# Patient Record
Sex: Female | Born: 2005 | Race: Black or African American | Hispanic: No | Marital: Single
Health system: Southern US, Community
[De-identification: ages and names within clinical notes are randomized; demographics above are authoritative.]

## PROBLEM LIST (undated history)

## (undated) DIAGNOSIS — Z789 Other specified health status: Secondary | ICD-10-CM

## (undated) HISTORY — PX: OTHER SURGICAL HISTORY: SHX169

## (undated) HISTORY — DX: Other specified health status: Z78.9

---
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
## 2023-01-16 DIAGNOSIS — Z113 Encounter for screening for infections with a predominantly sexual mode of transmission: Secondary | ICD-10-CM | POA: Diagnosis not present

## 2023-01-16 DIAGNOSIS — Z114 Encounter for screening for human immunodeficiency virus [HIV]: Secondary | ICD-10-CM | POA: Diagnosis not present

## 2023-01-23 DIAGNOSIS — A749 Chlamydial infection, unspecified: Secondary | ICD-10-CM | POA: Diagnosis not present

## 2023-03-03 DIAGNOSIS — N771 Vaginitis, vulvitis and vulvovaginitis in diseases classified elsewhere: Secondary | ICD-10-CM | POA: Diagnosis not present

## 2023-03-03 DIAGNOSIS — Z3202 Encounter for pregnancy test, result negative: Secondary | ICD-10-CM | POA: Diagnosis not present

## 2023-03-03 DIAGNOSIS — Z1331 Encounter for screening for depression: Secondary | ICD-10-CM | POA: Diagnosis not present

## 2023-03-03 DIAGNOSIS — Z01419 Encounter for gynecological examination (general) (routine) without abnormal findings: Secondary | ICD-10-CM | POA: Diagnosis not present

## 2023-03-22 DIAGNOSIS — N898 Other specified noninflammatory disorders of vagina: Secondary | ICD-10-CM | POA: Diagnosis not present

## 2023-05-07 ENCOUNTER — Encounter: Payer: Self-pay | Admitting: Advanced Practice Midwife

## 2023-05-07 ENCOUNTER — Ambulatory Visit: Payer: Medicaid Other | Admitting: Advanced Practice Midwife

## 2023-05-07 ENCOUNTER — Other Ambulatory Visit (HOSPITAL_COMMUNITY)
Admission: RE | Admit: 2023-05-07 | Discharge: 2023-05-07 | Disposition: A | Discharge status: Home / Self Care | Payer: Medicaid Other | Source: Ambulatory Visit | Attending: Advanced Practice Midwife | Admitting: Advanced Practice Midwife

## 2023-05-07 VITALS — BP 126/73 | HR 69 | Wt 181.0 lb

## 2023-05-07 DIAGNOSIS — Z113 Encounter for screening for infections with a predominantly sexual mode of transmission: Secondary | ICD-10-CM

## 2023-05-07 NOTE — Progress Notes (Signed)
GYN VISIT Patient name: Breanna Soto MRN [id number]  Date of birth: May 27, 2006 Chief Complaint:   Gynecologic Exam (Wants STD testing. No current symptoms)  History of Present Illness:   Breanna Soto is a 17 y.o. G0P0000 African-American female being seen today for STD testing; had unprotected sex ~3-4wks ago (prior to that hadn't had sex in a long time); not having any vag d/c/irritation/itching; no UTI s/s. Just concerned. Declines blood STD screening today.   Mentioned wanting to establish PCP- explained that we can be her gyn/future OB provider, but that she still needs a PCP for general health concerns.    Patient's last menstrual period was 04/28/2023 (exact date). The current method of family planning is none.  Last pap <21yo. Results were: N/A     05/07/2023    9:33 AM  Depression screen PHQ 2/9  Decreased Interest 0  Down, Depressed, Hopeless 0  PHQ - 2 Score 0  Altered sleeping 3  Tired, decreased energy 3  Change in appetite 2  Feeling bad or failure about yourself  0  Trouble concentrating 0  Moving slowly or fidgety/restless 0  Suicidal thoughts 0  PHQ-9 Score 8        05/07/2023    9:34 AM  GAD 7 : Generalized Anxiety Score  Nervous, Anxious, on Edge 3  Control/stop worrying 3  Worry too much - different things 3  Trouble relaxing 3  Restless 1  Easily annoyed or irritable 0  Afraid - awful might happen 0  Total GAD 7 Score 13     Review of Systems:   Pertinent items are noted in HPI Denies fever/chills, dizziness, headaches, visual disturbances, fatigue, shortness of breath, chest pain, abdominal pain, vomiting, abnormal vaginal discharge/itching/odor/irritation, problems with periods, bowel movements, urination, or intercourse unless otherwise stated above.  Pertinent History Reviewed:  Reviewed past medical,surgical, social, obstetrical and family history.  Reviewed problem list, medications and allergies. Physical Assessment:   Vitals:    05/07/23 0925  BP: 126/73  Pulse: 69  Weight: 181 lb (82.1 kg)  Body mass index is 29.21 kg/m.       Physical Examination:   General appearance: alert, well appearing, and in no distress  Mental status: alert, oriented to person, place, and time  Skin: warm & dry   Cardiovascular: normal heart rate noted  Respiratory: normal respiratory effort, no distress  Abdomen: soft, non-tender   Pelvic: normal external genitalia, vulva, vagina, cervix, uterus and adnexa  Extremities: no edema    No results found for this or any previous visit (from the past 24 hour(s)).  Assessment & Plan:  1) Concerned re STD exposure> CV swab to test all but yeast; declines STD bloodwork; discussion re need for contraception/condom use; declines contraception for now and is aware of options   Meds: No orders of the defined types were placed in this encounter.   No orders of the defined types were placed in this encounter.   No follow-ups on file.  Arabella Merles CNM 05/07/2023 9:58 AM

## 2023-05-08 LAB — CERVICOVAGINAL ANCILLARY ONLY
Bacterial Vaginitis (gardnerella): POSITIVE — AB
Chlamydia: NEGATIVE
Comment: NEGATIVE
Comment: NEGATIVE
Comment: NEGATIVE
Comment: NORMAL
Neisseria Gonorrhea: NEGATIVE
Trichomonas: NEGATIVE

## 2023-05-09 ENCOUNTER — Other Ambulatory Visit: Payer: Self-pay | Admitting: Advanced Practice Midwife

## 2023-05-09 DIAGNOSIS — B9689 Other specified bacterial agents as the cause of diseases classified elsewhere: Secondary | ICD-10-CM

## 2023-05-09 MED ORDER — METRONIDAZOLE 500 MG PO TABS: 500.0000 mg | ORAL_TABLET | Freq: Two times a day (BID) | ORAL | 0 refills | Status: DC

## 2023-06-24 ENCOUNTER — Other Ambulatory Visit: Payer: Self-pay | Admitting: Adult Health

## 2023-06-24 ENCOUNTER — Ambulatory Visit (INDEPENDENT_AMBULATORY_CARE_PROVIDER_SITE_OTHER): Payer: Medicaid Other

## 2023-06-24 VITALS — BP 133/91 | HR 69 | Ht 66.0 in | Wt 170.0 lb

## 2023-06-24 DIAGNOSIS — Z3201 Encounter for pregnancy test, result positive: Secondary | ICD-10-CM | POA: Diagnosis not present

## 2023-06-24 LAB — POCT URINE PREGNANCY: Preg Test, Ur: POSITIVE — AB

## 2023-06-24 MED ORDER — ONDANSETRON HCL 4 MG PO TABS: 4.0000 mg | ORAL_TABLET | Freq: Three times a day (TID) | ORAL | 1 refills | Status: DC | PRN

## 2023-06-24 MED ORDER — PRENATAL PLUS 27-1 MG PO TABS: 1.0000 | ORAL_TABLET | Freq: Every day | ORAL | 12 refills | Status: DC

## 2023-06-24 NOTE — Progress Notes (Signed)
Rx PNV and zofran

## 2023-06-24 NOTE — Progress Notes (Signed)
   NURSE VISIT- PREGNANCY CONFIRMATION   SUBJECTIVE:  Breanna Soto is a 17 y.o. G1P0000 female at [redacted]w[redacted]d by certain LMP of Patient's last menstrual period was 05/25/2023 (exact date). Here for pregnancy confirmation.  Home pregnancy test: positive x 4   She reports nausea, vomiting and cramping.  She is not taking prenatal vitamins.    OBJECTIVE:  BP (!) 133/91 (BP Location: Right Arm, Patient Position: Sitting, Cuff Size: Normal)   Pulse 69   Ht 5\' 6"  (1.676 m)   Wt 170 lb (77.1 kg)   LMP 05/25/2023 (Exact Date)   BMI 27.44 kg/m   Appears well, in no apparent distress  Results for orders placed or performed in visit on 06/24/23 (from the past 24 hour(s))  POCT urine pregnancy   Collection Time: 06/24/23  9:13 AM  Result Value Ref Range   Preg Test, Ur Positive (A) Negative    ASSESSMENT: Positive pregnancy test, [redacted]w[redacted]d by LMP    PLAN: Schedule for dating ultrasound in 3 weeks Prenatal vitamins: note routed to Cyril Mourning NP to send prescription   Nausea medicines: requested-note routed to Cyril Mourning NP to send prescription   OB packet given: Herminio Commons  06/24/2023 9:13 AM

## 2023-07-13 ENCOUNTER — Other Ambulatory Visit: Payer: Self-pay | Admitting: Adult Health

## 2023-07-13 DIAGNOSIS — O3680X0 Pregnancy with inconclusive fetal viability, not applicable or unspecified: Secondary | ICD-10-CM

## 2023-07-15 ENCOUNTER — Other Ambulatory Visit: Payer: Medicaid Other | Admitting: Radiology

## 2023-07-15 DIAGNOSIS — Z3491 Encounter for supervision of normal pregnancy, unspecified, first trimester: Secondary | ICD-10-CM | POA: Diagnosis not present

## 2023-07-15 DIAGNOSIS — Z3A01 Less than 8 weeks gestation of pregnancy: Secondary | ICD-10-CM | POA: Diagnosis not present

## 2023-07-15 DIAGNOSIS — O3680X0 Pregnancy with inconclusive fetal viability, not applicable or unspecified: Secondary | ICD-10-CM

## 2023-07-15 NOTE — Progress Notes (Addendum)
GA 7+2 by LMP 05/25/23  Retroflexed uterus with single early GS intact within mid fundus CRL = 9.1 mm correlates to [redacted]w[redacted]d   FHR = 131 bpm    YS = 4.7 mm Normal Rt ov  -  L ov not seen  -  neg adnexal regions  -  neg CDS - no free fluid

## 2023-08-19 ENCOUNTER — Other Ambulatory Visit: Payer: Self-pay | Admitting: Obstetrics & Gynecology

## 2023-08-19 ENCOUNTER — Encounter: Payer: Self-pay | Admitting: Women's Health

## 2023-08-19 DIAGNOSIS — Z3400 Encounter for supervision of normal first pregnancy, unspecified trimester: Secondary | ICD-10-CM | POA: Insufficient documentation

## 2023-08-19 DIAGNOSIS — Z3682 Encounter for antenatal screening for nuchal translucency: Secondary | ICD-10-CM

## 2023-08-21 ENCOUNTER — Ambulatory Visit (INDEPENDENT_AMBULATORY_CARE_PROVIDER_SITE_OTHER): Payer: Medicaid Other

## 2023-08-21 ENCOUNTER — Encounter: Payer: Self-pay | Admitting: Women's Health

## 2023-08-21 ENCOUNTER — Encounter: Payer: Medicaid Other | Admitting: *Deleted

## 2023-08-21 ENCOUNTER — Other Ambulatory Visit (HOSPITAL_COMMUNITY)
Admission: RE | Admit: 2023-08-21 | Discharge: 2023-08-21 | Disposition: A | Discharge status: Home / Self Care | Payer: Medicaid Other | Source: Ambulatory Visit | Attending: Women's Health | Admitting: Women's Health

## 2023-08-21 ENCOUNTER — Ambulatory Visit: Payer: Medicaid Other | Admitting: Women's Health

## 2023-08-21 VITALS — BP 122/66 | HR 62 | Wt 178.0 lb

## 2023-08-21 DIAGNOSIS — Z131 Encounter for screening for diabetes mellitus: Secondary | ICD-10-CM

## 2023-08-21 DIAGNOSIS — Z113 Encounter for screening for infections with a predominantly sexual mode of transmission: Secondary | ICD-10-CM | POA: Insufficient documentation

## 2023-08-21 DIAGNOSIS — O26891 Other specified pregnancy related conditions, first trimester: Secondary | ICD-10-CM | POA: Diagnosis not present

## 2023-08-21 DIAGNOSIS — Z1332 Encounter for screening for maternal depression: Secondary | ICD-10-CM

## 2023-08-21 DIAGNOSIS — Z23 Encounter for immunization: Secondary | ICD-10-CM

## 2023-08-21 DIAGNOSIS — N898 Other specified noninflammatory disorders of vagina: Secondary | ICD-10-CM

## 2023-08-21 DIAGNOSIS — Z6827 Body mass index (BMI) 27.0-27.9, adult: Secondary | ICD-10-CM

## 2023-08-21 DIAGNOSIS — Z3401 Encounter for supervision of normal first pregnancy, first trimester: Secondary | ICD-10-CM

## 2023-08-21 DIAGNOSIS — Z3682 Encounter for antenatal screening for nuchal translucency: Secondary | ICD-10-CM

## 2023-08-21 DIAGNOSIS — Z3A12 12 weeks gestation of pregnancy: Secondary | ICD-10-CM

## 2023-08-21 LAB — CERVICOVAGINAL ANCILLARY ONLY
Bacterial Vaginitis (gardnerella): POSITIVE — AB
Candida Glabrata: NEGATIVE
Candida Vaginitis: NEGATIVE
Chlamydia: NEGATIVE
Comment: NEGATIVE
Comment: NEGATIVE
Comment: NEGATIVE
Comment: NEGATIVE
Comment: NEGATIVE
Comment: NORMAL
Neisseria Gonorrhea: NEGATIVE
Trichomonas: NEGATIVE

## 2023-08-21 MED ORDER — BLOOD PRESSURE MONITOR MISC: 0 refills | Status: DC

## 2023-08-21 MED ORDER — PROMETHAZINE HCL 6.25 MG/5ML PO SOLN: 25.0000 mg | Freq: Four times a day (QID) | ORAL | 6 refills | Status: DC | PRN

## 2023-08-21 MED ORDER — ASPIRIN 81 MG PO TBEC: 81.0000 mg | DELAYED_RELEASE_TABLET | Freq: Every day | ORAL | 3 refills | Status: DC

## 2023-08-21 NOTE — Progress Notes (Signed)
 INITIAL OBSTETRICAL VISIT Patient name: Breanna Soto MRN [id number]  Date of birth: May 31, 2006 Chief Complaint:   Initial Prenatal Visit (Discharge with odor)  History of Present Illness:   Breanna Soto is a 18 y.o. G72P0000 African-American female at [redacted]w[redacted]d by LMP c/w u/s at 6 weeks with an Estimated Date of Delivery: 02/29/24 being seen today for her initial obstetrical visit.   Patient's last menstrual period was 05/25/2023 (exact date). Her obstetrical history is significant for primigravida.   Today she reports N/V-has rx for zofran , hard time swallowing it. Vaginal d/c w/ odor, no itching/irritation. Last pap <21yo. Results were: N/A     08/21/2023   10:41 AM 05/07/2023    9:33 AM  Depression screen PHQ 2/9  Decreased Interest 1 0  Down, Depressed, Hopeless 0 0  PHQ - 2 Score 1 0  Altered sleeping 2 3  Tired, decreased energy 2 3  Change in appetite 2 2  Feeling bad or failure about yourself  0 0  Trouble concentrating 0 0  Moving slowly or fidgety/restless 0 0  Suicidal thoughts 0 0  PHQ-9 Score 7 8        08/21/2023   10:42 AM 05/07/2023    9:34 AM  GAD 7 : Generalized Anxiety Score  Nervous, Anxious, on Edge 2 3  Control/stop worrying 2 3  Worry too much - different things 2 3  Trouble relaxing 1 3  Restless 0 1  Easily annoyed or irritable 1 0  Afraid - awful might happen 3 0  Total GAD 7 Score 11 13     Review of Systems:   Pertinent items are noted in HPI Denies cramping/contractions, leakage of fluid, vaginal bleeding, abnormal vaginal discharge w/ itching/odor/irritation, headaches, visual changes, shortness of breath, chest pain, abdominal pain, severe nausea/vomiting, or problems with urination or bowel movements unless otherwise stated above.  Pertinent History Reviewed:  Reviewed past medical,surgical, social, obstetrical and family history.  Reviewed problem list, medications and allergies. OB History  Gravida Para Term Preterm AB Living  1 0 0 0 0 0   SAB IAB Ectopic Multiple Live Births  0 0 0 0 0    # Outcome Date GA Lbr Len/2nd Weight Sex Type Anes PTL Lv  1 Current            Physical Assessment:   Vitals:   08/21/23 1123  BP: 122/66  Pulse: 62  Weight: 178 lb (80.7 kg)  Body mass index is 28.73 kg/m.       Physical Examination:  General appearance - well appearing, and in no distress  Mental status - alert, oriented to person, place, and time  Psych:  She has a normal mood and affect  Skin - warm and dry, normal color, no suspicious lesions noted  Chest - effort normal, all lung fields clear to auscultation bilaterally  Heart - normal rate and regular rhythm  Abdomen - soft, nontender  Extremities:  No swelling or varicosities noted  Pelvic - VULVA: normal appearing vulva with no masses, tenderness or lesions  VAGINA: normal appearing vagina with normal color and discharge, no lesions  CERVIX: normal appearing cervix without discharge or lesions, no CMT  Thin prep pap is not done   Chaperone: Peggy Dones    TODAY'S NT US  12+4 wks,measurements c/w dates,NB present,NT 1.5 mm,normal ovaries,anterior placenta,FHR 152 bpm,CRL 61.23 mm,limited view because of fetal position   No results found for this or any previous visit (from the past 24  hours).  Assessment & Plan:  1) Low-Risk Pregnancy G1P0000 at [redacted]w[redacted]d with an Estimated Date of Delivery: 02/29/24   2) Initial OB visit  3) Vaginal d/c w/ odor> CV swab   4) N/V> rx phenergan  liquid (hard time swallowing zofran  pills she has)  Meds:  Meds ordered this encounter  Medications   Blood Pressure Monitor MISC    Sig: For regular home bp monitoring during pregnancy    Dispense:  1 each    Refill:  0    Z34.81 Please mail to patient   aspirin  EC 81 MG tablet    Sig: Take 1 tablet (81 mg total) by mouth daily. Swallow whole.    Dispense:  90 tablet    Refill:  3   promethazine  (PHENERGAN ) 6.25 MG/5ML solution    Sig: Take 20 mLs (25 mg total) by mouth every 6 (six)  hours as needed for nausea or vomiting.    Dispense:  120 mL    Refill:  6    Initial labs obtained Continue prenatal vitamins Reviewed n/v relief measures and warning s/s to report Reviewed recommended weight gain based on pre-gravid BMI Encouraged well-balanced diet Genetic & carrier screening discussed: requests Panorama, NT/IT, and Horizon  Ultrasound discussed; fetal survey: requested CCNC completed> form faxed if has or is planning to apply for medicaid The nature of [employer] for [employer] with multiple MDs and other Advanced Practice Providers was explained to patient; also emphasized that fellows, residents, and students are part of our team. Does not have home bp cuff. Office bp cuff given: no. Rx sent: yes. Check bp weekly, let us  know if consistently >140/90.  Flu shot today  Indications for ASA therapy (per uptodate) OR Two or more of the following: Nulliparity Yes Sociodemographic characteristics (African American race, low socioeconomic level) Yes  Follow-up: Return in about 4 weeks (around 09/18/2023) for LROB, 2nd IT, CNM, in person; then 7wks from now anatomy u/s and LROB .   Orders Placed This Encounter  Procedures   Urine Culture   Integrated 1   Protein / creatinine ratio, urine   HORIZON CUSTOM   Hemoglobin A1c   CBC/D/Plt+RPR+Rh+ABO+RubIgG...   [facility] PRENATAL TEST    Suzen JONELLE Fetters CNM, [facility] 08/21/2023 11:47 AM

## 2023-08-21 NOTE — Patient Instructions (Signed)
Breanna Soto, thank you for choosing our office today! We appreciate the opportunity to meet your healthcare needs. You may receive a short survey by mail, e-mail, or through [employer]. If you are happy with your care we would appreciate if you could take just a few minutes to complete the survey questions. We read all of your comments and take your feedback very seriously. Thank you again for choosing our office.  [facility] at [place] at [facility] ([place], [place] [address]) Entrance C, located off of [address] parking   Nausea & Vomiting Have saltine crackers or pretzels by your bed and eat a few bites before you raise your head out of bed in the morning Eat small frequent meals throughout the day instead of large meals Drink plenty of fluids throughout the day to stay hydrated, just don't drink a lot of fluids with your meals.  This can make your stomach fill up faster making you feel sick Do not brush your teeth right after you eat Products with real ginger are good for nausea, like ginger ale and ginger hard candy Make sure it says made with real ginger! Sucking on sour candy like lemon heads is also good for nausea If your prenatal vitamins make you nauseated, take them at night so you will sleep through the nausea Sea Bands If you feel like you need medicine for the nausea & vomiting please let us know If you are unable to keep any fluids or food down please let us know   Constipation Drink plenty of fluid, preferably water, throughout the day Eat foods high in fiber such as fruits, vegetables, and grains Exercise, such as walking, is a good way to keep your bowels regular Drink warm fluids, especially warm prune juice, or decaf coffee Eat a 1/2 cup of real oatmeal (not instant), 1/2 cup applesauce, and 1/2-1 cup warm prune juice every day If needed, you may take Colace (docusate sodium) stool softener  once or twice a day to help keep the stool soft.  If you still are having problems with constipation, you may take Miralax once daily as needed to help keep your bowels regular.   Home Blood Pressure Monitoring for Patients   Your provider has recommended that you check your blood pressure (BP) at least once a week at home. If you do not have a blood pressure cuff at home, one will be provided for you. Contact your provider if you have not received your monitor within 1 week.   Helpful Tips for Accurate Home Blood Pressure Checks  Don't smoke, exercise, or drink caffeine 30 minutes before checking your BP Use the restroom before checking your BP (a full bladder can raise your pressure) Relax in a comfortable upright chair Feet on the ground Left arm resting comfortably on a flat surface at the level of your heart Legs uncrossed Back supported Sit quietly and don't talk Place the cuff on your bare arm Adjust snuggly, so that only two fingertips can fit between your skin and the top of the cuff Check 2 readings separated by at least one minute Keep a log of your BP readings For a visual, please reference this diagram: http://ccnc.care/bpdiagram  Provider Name: [facility]     Phone: [phone]  Zone 1: ALL CLEAR  Continue to monitor your symptoms:  BP reading is less than 140 (top number) or less than 90 (bottom  number)  No right upper stomach pain No headaches or seeing spots No feeling nauseated or throwing up No swelling in face and hands  Zone 2: CAUTION Call your doctor's office for any of the following:  BP reading is greater than 140 (top number) or greater than 90 (bottom number)  Stomach pain under your ribs in the middle or right side Headaches or seeing spots Feeling nauseated or throwing up Swelling in face and hands  Zone 3: EMERGENCY  Seek immediate medical care if you have any of the following:  BP reading is greater than160 (top number) or greater than  110 (bottom number) Severe headaches not improving with Tylenol Serious difficulty catching your breath Any worsening symptoms from Zone 2    First Trimester of Pregnancy The first trimester of pregnancy is from week 1 until the end of week 12 (months 1 through 3). A week after a sperm fertilizes an egg, the egg will implant on the wall of the uterus. This embryo will begin to develop into a baby. Genes from you and your partner are forming the baby. The female genes determine whether the baby is a boy or a girl. At 6-8 weeks, the eyes and face are formed, and the heartbeat can be seen on ultrasound. At the end of 12 weeks, all the baby's organs are formed.  Now that you are pregnant, you will want to do everything you can to have a healthy baby. Two of the most important things are to get good prenatal care and to follow your health care provider's instructions. Prenatal care is all the medical care you receive before the baby's birth. This care will help prevent, find, and treat any problems during the pregnancy and childbirth. BODY CHANGES Your body goes through many changes during pregnancy. The changes vary from woman to woman.  You may gain or lose a couple of pounds at first. You may feel sick to your stomach (nauseous) and throw up (vomit). If the vomiting is uncontrollable, call your health care provider. You may tire easily. You may develop headaches that can be relieved by medicines approved by your health care provider. You may urinate more often. Painful urination may mean you have a bladder infection. You may develop heartburn as a result of your pregnancy. You may develop constipation because certain hormones are causing the muscles that push waste through your intestines to slow down. You may develop hemorrhoids or swollen, bulging veins (varicose veins). Your breasts may begin to grow larger and become tender. Your nipples may stick out more, and the tissue that surrounds them  (areola) may become darker. Your gums may bleed and may be sensitive to brushing and flossing. Dark spots or blotches (chloasma, mask of pregnancy) may develop on your face. This will likely fade after the baby is born. Your menstrual periods will stop. You may have a loss of appetite. You may develop cravings for certain kinds of food. You may have changes in your emotions from day to day, such as being excited to be pregnant or being concerned that something may go wrong with the pregnancy and baby. You may have more vivid and strange dreams. You may have changes in your hair. These can include thickening of your hair, rapid growth, and changes in texture. Some women also have hair loss during or after pregnancy, or hair that feels dry or thin. Your hair will most likely return to normal after your baby is born. WHAT TO EXPECT AT YOUR PRENATAL  VISITS During a routine prenatal visit: You will be weighed to make sure you and the baby are growing normally. Your blood pressure will be taken. Your abdomen will be measured to track your baby's growth. The fetal heartbeat will be listened to starting around week 10 or 12 of your pregnancy. Test results from any previous visits will be discussed. Your health care provider may ask you: How you are feeling. If you are feeling the baby move. If you have had any abnormal symptoms, such as leaking fluid, bleeding, severe headaches, or abdominal cramping. If you have any questions. Other tests that may be performed during your first trimester include: Blood tests to find your blood type and to check for the presence of any previous infections. They will also be used to check for low iron levels (anemia) and Rh antibodies. Later in the pregnancy, blood tests for diabetes will be done along with other tests if problems develop. Urine tests to check for infections, diabetes, or protein in the urine. An ultrasound to confirm the proper growth and development  of the baby. An amniocentesis to check for possible genetic problems. Fetal screens for spina bifida and Down syndrome. You may need other tests to make sure you and the baby are doing well. HOME CARE INSTRUCTIONS  Medicines Follow your health care provider's instructions regarding medicine use. Specific medicines may be either safe or unsafe to take during pregnancy. Take your prenatal vitamins as directed. If you develop constipation, try taking a stool softener if your health care provider approves. Diet Eat regular, well-balanced meals. Choose a variety of foods, such as meat or vegetable-based protein, fish, milk and low-fat dairy products, vegetables, fruits, and whole grain breads and cereals. Your health care provider will help you determine the amount of weight gain that is right for you. Avoid raw meat and uncooked cheese. These carry germs that can cause birth defects in the baby. Eating four or five small meals rather than three large meals a day may help relieve nausea and vomiting. If you start to feel nauseous, eating a few soda crackers can be helpful. Drinking liquids between meals instead of during meals also seems to help nausea and vomiting. If you develop constipation, eat more high-fiber foods, such as fresh vegetables or fruit and whole grains. Drink enough fluids to keep your urine clear or pale yellow. Activity and Exercise Exercise only as directed by your health care provider. Exercising will help you: Control your weight. Stay in shape. Be prepared for labor and delivery. Experiencing pain or cramping in the lower abdomen or low back is a good sign that you should stop exercising. Check with your health care provider before continuing normal exercises. Try to avoid standing for long periods of time. Move your legs often if you must stand in one place for a long time. Avoid heavy lifting. Wear low-heeled shoes, and practice good posture. You may continue to have sex  unless your health care provider directs you otherwise. Relief of Pain or Discomfort Wear a good support bra for breast tenderness.   Take warm sitz baths to soothe any pain or discomfort caused by hemorrhoids. Use hemorrhoid cream if your health care provider approves.   Rest with your legs elevated if you have leg cramps or low back pain. If you develop varicose veins in your legs, wear support hose. Elevate your feet for 15 minutes, 3-4 times a day. Limit salt in your diet. Prenatal Care Schedule your prenatal visits by the  twelfth week of pregnancy. They are usually scheduled monthly at first, then more often in the last 2 months before delivery. Write down your questions. Take them to your prenatal visits. Keep all your prenatal visits as directed by your health care provider. Safety Wear your seat belt at all times when driving. Make a list of emergency phone numbers, including numbers for family, friends, the hospital, and police and fire departments. General Tips Ask your health care provider for a referral to a local prenatal education class. Begin classes no later than at the beginning of month 6 of your pregnancy. Ask for help if you have counseling or nutritional needs during pregnancy. Your health care provider can offer advice or refer you to specialists for help with various needs. Do not use hot tubs, steam rooms, or saunas. Do not douche or use tampons or scented sanitary pads. Do not cross your legs for long periods of time. Avoid cat litter boxes and soil used by cats. These carry germs that can cause birth defects in the baby and possibly loss of the fetus by miscarriage or stillbirth. Avoid all smoking, herbs, alcohol, and medicines not prescribed by your health care provider. Chemicals in these affect the formation and growth of the baby. Schedule a dentist appointment. At home, brush your teeth with a soft toothbrush and be gentle when you floss. SEEK MEDICAL CARE IF:   You have dizziness. You have mild pelvic cramps, pelvic pressure, or nagging pain in the abdominal area. You have persistent nausea, vomiting, or diarrhea. You have a bad smelling vaginal discharge. You have pain with urination. You notice increased swelling in your face, hands, legs, or ankles. SEEK IMMEDIATE MEDICAL CARE IF:  You have a fever. You are leaking fluid from your vagina. You have spotting or bleeding from your vagina. You have severe abdominal cramping or pain. You have rapid weight gain or loss. You vomit blood or material that looks like coffee grounds. You are exposed to [name] measles and have never had them. You are exposed to fifth disease or chickenpox. You develop a severe headache. You have shortness of breath. You have any kind of trauma, such as from a fall or a car accident. Document Released: 07/30/2001 Document Revised: 12/20/2013 Document Reviewed: 06/15/2013 [facility] Patient Information 2015 [place], [place]. This information is not intended to replace advice given to you by your health care provider. Make sure you discuss any questions you have with your health care provider.

## 2023-08-21 NOTE — Progress Notes (Addendum)
 [name] 12+4 wks,measurements c/w dates,NB present,NT 1.5 mm,normal ovaries,anterior placenta,FHR 152 bpm,CRL 61.23 mm,limited view because of fetal position

## 2023-08-22 LAB — INTEGRATED 1

## 2023-08-23 LAB — CBC/D/PLT+RPR+RH+ABO+RUBIGG...
Antibody Screen: NEGATIVE
Basophils Absolute: 0 10*3/uL (ref 0.0–0.2)
Basos: 0 %
EOS (ABSOLUTE): 0.1 10*3/uL (ref 0.0–0.4)
Eos: 1 %
HCV Ab: NONREACTIVE
HIV Screen 4th Generation wRfx: NONREACTIVE
Hematocrit: 40.9 % (ref 34.0–46.6)
Hemoglobin: 13.4 g/dL (ref 11.1–15.9)
Hepatitis B Surface Ag: NEGATIVE
Immature Grans (Abs): 0 10*3/uL (ref 0.0–0.1)
Immature Granulocytes: 0 %
Lymphocytes Absolute: 1.9 10*3/uL (ref 0.7–3.1)
Lymphs: 19 %
MCH: 27.6 pg (ref 26.6–33.0)
MCHC: 32.8 g/dL (ref 31.5–35.7)
MCV: 84 fL (ref 79–97)
Monocytes Absolute: 0.5 10*3/uL (ref 0.1–0.9)
Monocytes: 5 %
Neutrophils Absolute: 7.3 10*3/uL — ABNORMAL HIGH (ref 1.4–7.0)
Neutrophils: 75 %
Platelets: 204 10*3/uL (ref 150–450)
RBC: 4.85 x10E6/uL (ref 3.77–5.28)
RDW: 14.5 % (ref 11.7–15.4)
RPR Ser Ql: NONREACTIVE
Rh Factor: POSITIVE
Rubella Antibodies, IGG: 1.48 {index} (ref >0.99)
WBC: 9.9 10*3/uL (ref 3.4–10.8)

## 2023-08-23 LAB — HEMOGLOBIN A1C
Est. average glucose Bld gHb Est-mCnc: 91 mg/dL
Hgb A1c MFr Bld: 4.8 % (ref 4.8–5.6)

## 2023-08-23 LAB — INTEGRATED 1
Crown Rump Length: 61.2 mm
Gest. Age on Collection Date: 12.4 wk
PAPP-A Value: 293.4 ng/mL
Race: 1
Sonographer ID#: 20.4 a
Sonographer ID#: 309760
Weight: 1.5 mm
Weight: 178 [lb_av]

## 2023-08-23 LAB — HCV INTERPRETATION

## 2023-08-23 LAB — PROTEIN / CREATININE RATIO, URINE
Creatinine, Urine: 168.5 mg/dL
Protein, Ur: 9 mg/dL
Protein/Creat Ratio: 53 mg/g{creat} (ref 0–200)

## 2023-08-23 LAB — URINE CULTURE

## 2023-08-25 ENCOUNTER — Other Ambulatory Visit: Payer: Self-pay | Admitting: Adult Health

## 2023-08-25 MED ORDER — METRONIDAZOLE 500 MG PO TABS: 500.0000 mg | ORAL_TABLET | Freq: Two times a day (BID) | ORAL | 0 refills | Status: DC

## 2023-08-28 LAB — PANORAMA PRENATAL TEST FULL PANEL:PANORAMA TEST PLUS 5 ADDITIONAL MICRODELETIONS: FETAL FRACTION: 9.5

## 2023-08-30 DIAGNOSIS — Z3401 Encounter for supervision of normal first pregnancy, first trimester: Secondary | ICD-10-CM | POA: Diagnosis not present

## 2023-08-30 LAB — HORIZON CUSTOM: REPORT SUMMARY: POSITIVE — AB

## 2023-09-01 ENCOUNTER — Encounter: Payer: Self-pay | Admitting: Women's Health

## 2023-09-01 DIAGNOSIS — O285 Abnormal chromosomal and genetic finding on antenatal screening of mother: Secondary | ICD-10-CM | POA: Insufficient documentation

## 2023-09-05 ENCOUNTER — Other Ambulatory Visit: Payer: Self-pay

## 2023-09-05 ENCOUNTER — Encounter (HOSPITAL_COMMUNITY): Payer: Self-pay

## 2023-09-05 ENCOUNTER — Emergency Department (HOSPITAL_COMMUNITY)
Admission: EM | Admit: 2023-09-05 | Discharge: 2023-09-05 | Discharge status: Against Medical Advice | Payer: Medicaid Other | Attending: Emergency Medicine | Admitting: Emergency Medicine

## 2023-09-05 DIAGNOSIS — Z3A14 14 weeks gestation of pregnancy: Secondary | ICD-10-CM | POA: Insufficient documentation

## 2023-09-05 DIAGNOSIS — O218 Other vomiting complicating pregnancy: Secondary | ICD-10-CM | POA: Diagnosis not present

## 2023-09-05 DIAGNOSIS — Z5321 Procedure and treatment not carried out due to patient leaving prior to being seen by health care provider: Secondary | ICD-10-CM | POA: Diagnosis not present

## 2023-09-05 LAB — CBC WITH DIFFERENTIAL/PLATELET
Abs Immature Granulocytes: 0.05 10*3/uL (ref 0.00–0.07)
Basophils Absolute: 0 10*3/uL (ref 0.0–0.1)
Basophils Relative: 0 %
Eosinophils Absolute: 0 10*3/uL (ref 0.0–0.5)
Eosinophils Relative: 0 %
HCT: 42.7 % (ref 36.0–46.0)
Hemoglobin: 15.2 g/dL — ABNORMAL HIGH (ref 12.0–15.0)
Immature Granulocytes: 0 %
Lymphocytes Relative: 11 %
Lymphs Abs: 1.4 10*3/uL (ref 0.7–4.0)
MCH: 28.9 pg (ref 26.0–34.0)
MCHC: 35.6 g/dL (ref 30.0–36.0)
MCV: 81.2 fL (ref 80.0–100.0)
Monocytes Absolute: 0.4 10*3/uL (ref 0.1–1.0)
Monocytes Relative: 3 %
Neutro Abs: 10.8 10*3/uL — ABNORMAL HIGH (ref 1.7–7.7)
Neutrophils Relative %: 86 %
Platelets: 218 10*3/uL (ref 150–400)
RBC: 5.26 MIL/uL — ABNORMAL HIGH (ref 3.87–5.11)
RDW: 13.6 % (ref 11.5–15.5)
WBC: 12.7 10*3/uL — ABNORMAL HIGH (ref 4.0–10.5)
nRBC: 0 % (ref 0.0–0.2)

## 2023-09-05 LAB — BASIC METABOLIC PANEL
Anion gap: 8 (ref 5–15)
BUN: 7 mg/dL (ref 6–20)
CO2: 22 mmol/L (ref 22–32)
Calcium: 9.8 mg/dL (ref 8.9–10.3)
Chloride: 104 mmol/L (ref 98–111)
Creatinine, Ser: 0.68 mg/dL (ref 0.44–1.00)
GFR, Estimated: >60 mL/min (ref >60)
Glucose, Bld: 91 mg/dL (ref 70–99)
Potassium: 3.8 mmol/L (ref 3.5–5.1)
Sodium: 134 mmol/L — ABNORMAL LOW (ref 135–145)

## 2023-09-05 NOTE — ED Triage Notes (Signed)
Pt stated that she is [redacted] wks pregnant and began throwing up and noticed a bright red tinge in her vomit.

## 2023-09-18 ENCOUNTER — Encounter: Payer: Self-pay | Admitting: Advanced Practice Midwife

## 2023-09-18 ENCOUNTER — Ambulatory Visit (INDEPENDENT_AMBULATORY_CARE_PROVIDER_SITE_OTHER): Payer: Medicaid Other | Admitting: Advanced Practice Midwife

## 2023-09-18 VITALS — BP 123/70 | HR 94 | Wt 182.0 lb

## 2023-09-18 DIAGNOSIS — Z3402 Encounter for supervision of normal first pregnancy, second trimester: Secondary | ICD-10-CM

## 2023-09-18 DIAGNOSIS — Z3A16 16 weeks gestation of pregnancy: Secondary | ICD-10-CM

## 2023-09-18 DIAGNOSIS — Z1379 Encounter for other screening for genetic and chromosomal anomalies: Secondary | ICD-10-CM

## 2023-09-18 DIAGNOSIS — Z3401 Encounter for supervision of normal first pregnancy, first trimester: Secondary | ICD-10-CM

## 2023-09-18 NOTE — Patient Instructions (Signed)
Marlia, if you would like to speak with a Dentist (no charge) to discuss your test results and/or set up testing for your partner, you may schedule a telephone genetic information session by calling [phone]>option 5 (all inquiries)> option 1 (patient)> option 4 (schedule genetic information session) or visiting [email or web address] Let me know if you have any questions.  Drenda Freeze

## 2023-09-18 NOTE — Progress Notes (Addendum)
   LOW-RISK PREGNANCY VISIT Patient name: Breanna Soto MRN [id number]  Date of birth: 01-01-2006 Chief Complaint:   Routine Prenatal Visit (2nd IT)  History of Present Illness:   Breanna Soto is a 18 y.o. G12P0000 female at [redacted]w[redacted]d with an Estimated Date of Delivery: 02/29/24 being seen today for ongoing management of a low-risk pregnancy.  Today she reports fatigue. Contractions: Not present.  .  Movement: Absent. denies leaking of fluid. Review of Systems:   Pertinent items are noted in HPI Denies abnormal vaginal discharge w/ itching/odor/irritation, headaches, visual changes, shortness of breath, chest pain, abdominal pain, severe nausea/vomiting, or problems with urination or bowel movements unless otherwise stated above. Pertinent History Reviewed:  Reviewed past medical,surgical, social, obstetrical and family history.  Reviewed problem list, medications and allergies. Physical Assessment:   Vitals:   09/18/23 0826  BP: 123/70  Pulse: 94  Weight: 182 lb (82.6 kg)  Body mass index is 29.38 kg/m.        Physical Examination:   General appearance: Well appearing, and in no distress  Mental status: Alert, oriented to person, place, and time  Skin: Warm & dry  Cardiovascular: Normal heart rate noted  Respiratory: Normal respiratory effort, no distress  Abdomen: Soft, gravid, nontender  Pelvic: Cervical exam deferred         Extremities: Edema: None Chaperone:  N/A   Fetal Status: Fetal Heart Rate (bpm): 152   Movement: Absent      No results found for this or any previous visit (from the past 24 hours).  Assessment & Plan:    Pregnancy: G1P0000 at [redacted]w[redacted]d 1. Encounter for supervision of normal first pregnancy in first trimester (Primary)   2. [redacted] weeks gestation of pregnancy   3. Genetic testing  - [facility] Couldn't get into [employer] website to set up genetic counseling/testing for alpha thal/Hgb C carrier--information given      Meds: No orders of the defined types  were placed in this encounter.  Labs/procedures today: 2nd IT  Plan:  Continue routine obstetrical care  Next visit: prefers will be in person for anatomy scan     Reviewed:  general obstetric precautions including but not limited to vaginal bleeding, contractions, leaking of fluid and fetal movement were reviewed in detail with the patient.  All questions were answered. Has home bp cuff.. Check bp weekly, let us know if >140/90.   Follow-up: No follow-ups on file.  Future Appointments  Date Time Provider Department Center  10/13/2023  9:15 AM [facility] [name] [facility] None  10/13/2023 10:10 AM Cheral Marker, CNM [facility]    Orders Placed This Encounter  Procedures   [facility]   Jacklyn Shell DNP, CNM 09/18/2023 9:05 AM

## 2023-09-20 LAB — INTEGRATED 2
AFP MoM: 1.16
Alpha-Fetoprotein: 37.4 ng/mL
Crown Rump Length: 61.2 mm
DIA MoM: 2.31
DIA Value: 338.4 pg/mL
Estriol, Unconjugated: 0.55 ng/mL
Gest. Age on Collection Date: 12.4 wk
Gestational Age: 16.4 wk
Maternal Age at EDD: 20.4 a
Nuchal Translucency (NT): 1.5 mm
Nuchal Translucency MoM: 0.88
Number of Fetuses: 1
PAPP-A MoM: 0.37
PAPP-A Value: 293.4 ng/mL
Sonographer ID#: 309760
Test Results:: NEGATIVE
Weight: 178 [lb_av]
Weight: 178 [lb_av]
hCG MoM: 1.77
hCG Value: 52.7 [IU]/mL
uE3 MoM: 0.63

## 2023-09-22 ENCOUNTER — Encounter: Payer: Self-pay | Admitting: Women's Health

## 2023-10-09 ENCOUNTER — Other Ambulatory Visit: Payer: Self-pay | Admitting: Obstetrics & Gynecology

## 2023-10-09 DIAGNOSIS — Z363 Encounter for antenatal screening for malformations: Secondary | ICD-10-CM

## 2023-10-13 ENCOUNTER — Ambulatory Visit: Payer: Medicaid Other | Admitting: Women's Health

## 2023-10-13 ENCOUNTER — Ambulatory Visit: Payer: Medicaid Other

## 2023-10-13 ENCOUNTER — Encounter: Payer: Self-pay | Admitting: Women's Health

## 2023-10-13 VITALS — BP 132/85 | HR 75 | Wt 188.3 lb

## 2023-10-13 DIAGNOSIS — Z3A20 20 weeks gestation of pregnancy: Secondary | ICD-10-CM | POA: Diagnosis not present

## 2023-10-13 DIAGNOSIS — O285 Abnormal chromosomal and genetic finding on antenatal screening of mother: Secondary | ICD-10-CM

## 2023-10-13 DIAGNOSIS — Z3402 Encounter for supervision of normal first pregnancy, second trimester: Secondary | ICD-10-CM | POA: Diagnosis not present

## 2023-10-13 DIAGNOSIS — Z363 Encounter for antenatal screening for malformations: Secondary | ICD-10-CM

## 2023-10-13 NOTE — Patient Instructions (Signed)
 Breanna Soto, thank you for choosing our office today! We appreciate the opportunity to meet your healthcare needs. You may receive a short survey by mail, e-mail, or through [employer]. If you are happy with your care we would appreciate if you could take just a few minutes to complete the survey questions. We read all of your comments and take your feedback very seriously. Thank you again for choosing our office.  Center for [employer] Team at [facility] at [facility] ([address] [place], [place] [address]) Entrance C, located off of [address] Free 24/7 valet parking  Go to [email or web address] to register for FREE online childbirth classes  Call the office [phone]) or go to [facility] if: You begin to severe cramping Your water breaks.  Sometimes it is a big gush of fluid, sometimes it is just a trickle that keeps getting your panties wet or running down your legs You have vaginal bleeding.  It is normal to have a small amount of spotting if your cervix was checked.   [facility] Pediatricians/Family Doctors [facility]): [address] Colette Ribas, [phone]           [facility]: [address], [phone]                [facility]): [address], [phone] (call to ask if accepting patients) [facility] Department: [address], [place], [phone]    [facility] Pediatricians/Family Doctors [facility]): [phone] [address], [phone] [facility]: [address] [place], [phone] [facility]: [address], [phone]  [facility] Doctors  [facility]): [phone] [facility]: [address] [phone]   [facility] Doctors [facility]: [address], [phone]  [facility] Doctors  [facility]: [phone], [phone]  Home Blood Pressure Monitoring for Patients   Your provider has recommended that you check your blood pressure (BP) at least once a week at home. If you do not have a blood pressure cuff at home, one will be provided for you. Contact your provider if you have not received your monitor within 1 week.   Helpful Tips for Accurate Home Blood Pressure Checks  Don't smoke, exercise, or drink caffeine 30 minutes before checking your BP Use the restroom before checking your BP (a full bladder can raise your pressure) Relax in a comfortable upright chair Feet on the ground Left arm resting comfortably on a flat surface at the level of your heart Legs uncrossed Back supported Sit quietly and don't talk Place the cuff on your bare arm Adjust snuggly, so that only two fingertips can fit between your skin and the top of the cuff Check 2 readings separated by at least one minute Keep a log of your BP readings For a visual, please reference this diagram: http://ccnc.care/bpdiagram  Provider Name: [facility]     Phone: [phone]  Zone 1: ALL CLEAR  Continue to monitor your symptoms:  BP reading is less than 140 (top number) or less than 90 (bottom number)  No right upper stomach pain No headaches or seeing spots No feeling nauseated or throwing up No swelling in face and hands  Zone 2: CAUTION Call your doctor's office for any of the following:  BP reading is greater than 140 (top number) or greater than  90 (bottom number)  Stomach pain under your ribs in the middle or right side Headaches or seeing spots Feeling nauseated or throwing up Swelling in face and hands  Zone 3: EMERGENCY  Seek immediate medical care if you have any of the following:  BP reading is greater than160 (top number) or greater than 110 (bottom number) Severe headaches not improving with Tylenol Serious difficulty catching your breath Any worsening symptoms from  Zone 2     Second Trimester of Pregnancy The second trimester is from week 14 through week 27 (months 4 through 6). The second trimester is often a time when you feel your best. Your body has adjusted to being pregnant, and you begin to feel better physically. Usually, morning sickness has lessened or quit completely, you may have more energy, and you may have an increase in appetite. The second trimester is also a time when the fetus is growing rapidly. At the end of the sixth month, the fetus is about 9 inches long and weighs about 1 pounds. You will likely begin to feel the baby move (quickening) between 16 and 20 weeks of pregnancy. Body changes during your second trimester Your body continues to go through many changes during your second trimester. The changes vary from woman to woman. Your weight will continue to increase. You will notice your lower abdomen bulging out. You may begin to get stretch marks on your hips, abdomen, and breasts. You may develop headaches that can be relieved by medicines. The medicines should be approved by your health care provider. You may urinate more often because the fetus is pressing on your bladder. You may develop or continue to have heartburn as a result of your pregnancy. You may develop constipation because certain hormones are causing the muscles that push waste through your intestines to slow down. You may develop hemorrhoids or swollen, bulging veins (varicose veins). You may have back pain. This is caused by: Weight gain. Pregnancy hormones that are relaxing the joints in your pelvis. A shift in weight and the muscles that support your balance. Your breasts will continue to grow and they will continue to become tender. Your gums may bleed and may be sensitive to brushing and flossing. Dark spots or blotches (chloasma, mask of pregnancy) may develop on your face. This will likely fade after the baby is born. A dark line from your belly button to  the pubic area (linea nigra) may appear. This will likely fade after the baby is born. You may have changes in your hair. These can include thickening of your hair, rapid growth, and changes in texture. Some women also have hair loss during or after pregnancy, or hair that feels dry or thin. Your hair will most likely return to normal after your baby is born.  What to expect at prenatal visits During a routine prenatal visit: You will be weighed to make sure you and the fetus are growing normally. Your blood pressure will be taken. Your abdomen will be measured to track your baby's growth. The fetal heartbeat will be listened to. Any test results from the previous visit will be discussed.  Your health care provider may ask you: How you are feeling. If you are feeling the baby move. If you have had any abnormal symptoms, such as leaking fluid, bleeding, severe headaches, or abdominal cramping. If you are using any tobacco products, including cigarettes, chewing tobacco, and electronic cigarettes. If you have any questions.  Other tests that may be performed during  your second trimester include: Blood tests that check for: Low iron levels (anemia). High blood sugar that affects pregnant women (gestational diabetes) between 79 and 28 weeks. Rh antibodies. This is to check for a protein on red blood cells (Rh factor). Urine tests to check for infections, diabetes, or protein in the urine. An ultrasound to confirm the proper growth and development of the baby. An amniocentesis to check for possible genetic problems. Fetal screens for spina bifida and Down syndrome. HIV (human immunodeficiency virus) testing. Routine prenatal testing includes screening for HIV, unless you choose not to have this test.  Follow these instructions at home: Medicines Follow your health care provider's instructions regarding medicine use. Specific medicines may be either safe or unsafe to take during  pregnancy. Take a prenatal vitamin that contains at least 600 micrograms (mcg) of folic acid. If you develop constipation, try taking a stool softener if your health care provider approves. Eating and drinking Eat a balanced diet that includes fresh fruits and vegetables, whole grains, good sources of protein such as meat, eggs, or tofu, and low-fat dairy. Your health care provider will help you determine the amount of weight gain that is right for you. Avoid raw meat and uncooked cheese. These carry germs that can cause birth defects in the baby. If you have low calcium intake from food, talk to your health care provider about whether you should take a daily calcium supplement. Limit foods that are high in fat and processed sugars, such as fried and sweet foods. To prevent constipation: Drink enough fluid to keep your urine clear or pale yellow. Eat foods that are high in fiber, such as fresh fruits and vegetables, whole grains, and beans. Activity Exercise only as directed by your health care provider. Most women can continue their usual exercise routine during pregnancy. Try to exercise for 30 minutes at least 5 days a week. Stop exercising if you experience uterine contractions. Avoid heavy lifting, wear low heel shoes, and practice good posture. A sexual relationship may be continued unless your health care provider directs you otherwise. Relieving pain and discomfort Wear a good support bra to prevent discomfort from breast tenderness. Take warm sitz baths to soothe any pain or discomfort caused by hemorrhoids. Use hemorrhoid cream if your health care provider approves. Rest with your legs elevated if you have leg cramps or low back pain. If you develop varicose veins, wear support hose. Elevate your feet for 15 minutes, 3-4 times a day. Limit salt in your diet. Prenatal Care Write down your questions. Take them to your prenatal visits. Keep all your prenatal visits as told by your health  care provider. This is important. Safety Wear your seat belt at all times when driving. Make a list of emergency phone numbers, including numbers for family, friends, the hospital, and police and fire departments. General instructions Ask your health care provider for a referral to a local prenatal education class. Begin classes no later than the beginning of month 6 of your pregnancy. Ask for help if you have counseling or nutritional needs during pregnancy. Your health care provider can offer advice or refer you to specialists for help with various needs. Do not use hot tubs, steam rooms, or saunas. Do not douche or use tampons or scented sanitary pads. Do not cross your legs for long periods of time. Avoid cat litter boxes and soil used by cats. These carry germs that can cause birth defects in the baby and possibly loss of the  fetus by miscarriage or stillbirth. Avoid all smoking, herbs, alcohol, and unprescribed drugs. Chemicals in these products can affect the formation and growth of the baby. Do not use any products that contain nicotine or tobacco, such as cigarettes and e-cigarettes. If you need help quitting, ask your health care provider. Visit your dentist if you have not gone yet during your pregnancy. Use a soft toothbrush to brush your teeth and be gentle when you floss. Contact a health care provider if: You have dizziness. You have mild pelvic cramps, pelvic pressure, or nagging pain in the abdominal area. You have persistent nausea, vomiting, or diarrhea. You have a bad smelling vaginal discharge. You have pain when you urinate. Get help right away if: You have a fever. You are leaking fluid from your vagina. You have spotting or bleeding from your vagina. You have severe abdominal cramping or pain. You have rapid weight gain or weight loss. You have shortness of breath with chest pain. You notice sudden or extreme swelling of your face, hands, ankles, feet, or legs. You  have not felt your baby move in over an hour. You have severe headaches that do not go away when you take medicine. You have vision changes. Summary The second trimester is from week 14 through week 27 (months 4 through 6). It is also a time when the fetus is growing rapidly. Your body goes through many changes during pregnancy. The changes vary from woman to woman. Avoid all smoking, herbs, alcohol, and unprescribed drugs. These chemicals affect the formation and growth your baby. Do not use any tobacco products, such as cigarettes, chewing tobacco, and e-cigarettes. If you need help quitting, ask your health care provider. Contact your health care provider if you have any questions. Keep all prenatal visits as told by your health care provider. This is important. This information is not intended to replace advice given to you by your health care provider. Make sure you discuss any questions you have with your health care provider. Document Released: 07/30/2001 Document Revised: 01/11/2016 Document Reviewed: 10/06/2012 Elsevier Interactive Patient Education  2017 [employer].

## 2023-10-13 NOTE — Progress Notes (Signed)
 LOW-RISK PREGNANCY VISIT Patient name: Breanna Soto MRN [id number]  Date of birth: 01-13-2006 Chief Complaint:   Routine Prenatal Visit and Pregnancy Ultrasound  History of Present Illness:   Breanna Soto is a 18 y.o. G61P0000 female at [redacted]w[redacted]d with an Estimated Date of Delivery: 02/29/24 being seen today for ongoing management of a low-risk pregnancy.   Today she reports swelling legs/feet.  Contractions: Not present.  .   . denies leaking of fluid.     08/21/2023   10:41 AM 05/07/2023    9:33 AM  Depression screen PHQ 2/9  Decreased Interest 1 0  Down, Depressed, Hopeless 0 0  PHQ - 2 Score 1 0  Altered sleeping 2 3  Tired, decreased energy 2 3  Change in appetite 2 2  Feeling bad or failure about yourself  0 0  Trouble concentrating 0 0  Moving slowly or fidgety/restless 0 0  Suicidal thoughts 0 0  PHQ-9 Score 7 8        08/21/2023   10:42 AM 05/07/2023    9:34 AM  GAD 7 : Generalized Anxiety Score  Nervous, Anxious, on Edge 2 3  Control/stop worrying 2 3  Worry too much - different things 2 3  Trouble relaxing 1 3  Restless 0 1  Easily annoyed or irritable 1 0  Afraid - awful might happen 3 0  Total GAD 7 Score 11 13      Review of Systems:   Pertinent items are noted in HPI Denies abnormal vaginal discharge w/ itching/odor/irritation, headaches, visual changes, shortness of breath, chest pain, abdominal pain, severe nausea/vomiting, or problems with urination or bowel movements unless otherwise stated above. Pertinent History Reviewed:  Reviewed past medical,surgical, social, obstetrical and family history.  Reviewed problem list, medications and allergies. Physical Assessment:   Vitals:   10/13/23 1017  BP: 132/85  Pulse: 75  Weight: 188 lb 4.8 oz (85.4 kg)  Body mass index is 30.39 kg/m.        Physical Examination:   General appearance: Well appearing, and in no distress  Mental status: Alert, oriented to person, place, and time  Skin: Warm &  dry  Cardiovascular: Normal heart rate noted  Respiratory: Normal respiratory effort, no distress  Abdomen: Soft, gravid, nontender  Pelvic: Cervical exam deferred         Extremities: Edema: Trace  Fetal Status:        Breanna Soto 20+1 wks,breech,anterior placenta gr 0,normal ovaries,CX 3.8 cm,FHR 126 bpm,SVP of fluid 6.8 cm,EFW 351 g 59%,anatomy complete,no obvious abnormalities   Chaperone: N/A   No results found for this or any previous visit (from the past 24 hours).  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  Labs/procedures today: U/S  Plan:  Continue routine obstetrical care  Next visit: prefers in person    Reviewed: Preterm labor symptoms and general obstetric precautions including but not limited to vaginal bleeding, contractions, leaking of fluid and fetal movement were reviewed in detail with the patient.  All questions were answered. Does have home bp cuff. Office bp cuff given: not applicable. Check bp weekly, let Breanna Soto know if consistently >140 and/or >90.  Follow-up: Return in about 4 weeks (around 11/10/2023)  for LROB, CNM, in person.  Future Appointments  Date Time Provider Department Center  11/10/2023  9:10 AM Cheral Marker, CNM [facility]    No orders of the defined types were placed in this encounter.  Cheral Marker CNM, [facility] 10/13/2023 10:46 AM

## 2023-10-13 NOTE — Progress Notes (Signed)
 [name] 20+1 wks,breech,anterior placenta gr 0,normal ovaries,CX 3.8 cm,FHR 126 bpm,SVP of fluid 6.8 cm,EFW 351 g 59%,anatomy complete,no obvious abnormalities

## 2023-11-10 ENCOUNTER — Encounter: Payer: Self-pay | Admitting: Women's Health

## 2023-11-10 ENCOUNTER — Ambulatory Visit: Payer: Medicaid Other | Admitting: Women's Health

## 2023-11-10 VITALS — BP 116/76 | HR 106 | Wt 192.0 lb

## 2023-11-10 DIAGNOSIS — O285 Abnormal chromosomal and genetic finding on antenatal screening of mother: Secondary | ICD-10-CM

## 2023-11-10 DIAGNOSIS — Z3402 Encounter for supervision of normal first pregnancy, second trimester: Secondary | ICD-10-CM

## 2023-11-10 DIAGNOSIS — Z3A24 24 weeks gestation of pregnancy: Secondary | ICD-10-CM

## 2023-11-10 NOTE — Patient Instructions (Signed)
 Breanna Soto, thank you for choosing our office today! We appreciate the opportunity to meet your healthcare needs. You may receive a short survey by mail, e-mail, or through [employer]. If you are happy with your care we would appreciate if you could take just a few minutes to complete the survey questions. We read all of your comments and take your feedback very seriously. Thank you again for choosing our office.  Center for [employer] Team at [facility]  [facility] at [facility] ([address] [place], [place] [address]) Entrance C, located off of [address] Free 24/7 valet parking   You will have your sugar test next visit.  Please do not eat or drink anything after midnight the night before you come, not even water.  You will be here for at least two hours.  Please make an appointment online for the bloodwork at [email or web address] for 8:00am (or as close to this as possible). Make sure you select the [facility] service center.   CLASSES: Go to [email or web address] to register for classes (childbirth, breastfeeding, waterbirth, infant CPR, daddy bootcamp, etc.)  Call the office [phone]) or go to [facility] if: You begin to have strong, frequent contractions Your water breaks.  Sometimes it is a big gush of fluid, sometimes it is just a trickle that keeps getting your panties wet or running down your legs You have vaginal bleeding.  It is normal to have a small amount of spotting if your cervix was checked.  You don't feel your baby moving like normal.  If you don't, get you something to eat and drink and lay down and focus on feeling your baby move.   If your baby is still not moving like normal, you should call the office or go to [facility].  Call the office [phone]) or go to [facility] hospital for these signs of pre-eclampsia: Severe headache that does not go away with Tylenol Visual changes- seeing spots, double, blurred vision Pain under your right breast or upper  abdomen that does not go away with Tums or heartburn medicine Nausea and/or vomiting Severe swelling in your hands, feet, and face     Pediatricians/Family Doctors  [facility]): [address] [place], [phone]           [facility]: [address], [phone]                [facility]): [address], [phone]  [facility] Department: [address], [place], [phone]    [facility] Pediatricians/Family Doctors [facility]): [address], [phone] [facility]: [address] [place], [phone] [facility]: [address], [phone]  [facility] Doctors  [facility]): [phone] [facility]: [address] [phone]   [facility] Doctors [facility]: [address] [phone]  [facility] Doctors  [facility]: [phone], [phone]  Home Blood Pressure Monitoring for Patients   Your provider has recommended that you check your blood pressure (BP) at least once a week at home. If you do not have a blood pressure cuff at home, one will be provided for you. Contact your provider if you have not received your monitor within 1 week.   Helpful Tips for Accurate Home Blood Pressure Checks  Don't smoke, exercise, or drink caffeine 30 minutes before checking  your BP Use the restroom before checking your BP (a full bladder can raise your pressure) Relax in a comfortable upright chair Feet on the ground Left arm resting comfortably on a flat surface at the level of your heart Legs uncrossed Back supported Sit quietly and don't talk Place the cuff on your bare arm Adjust snuggly, so that only two fingertips can fit between your skin and the top of the cuff Check 2 readings separated by at least one  minute Keep a log of your BP readings For a visual, please reference this diagram: http://ccnc.care/bpdiagram  Provider Name: [facility]     Phone: [phone]  Zone 1: ALL CLEAR  Continue to monitor your symptoms:  BP reading is less than 140 (top number) or less than 90 (bottom number)  No right upper stomach pain No headaches or seeing spots No feeling nauseated or throwing up No swelling in face and hands  Zone 2: CAUTION Call your doctor's office for any of the following:  BP reading is greater than 140 (top number) or greater than 90 (bottom number)  Stomach pain under your ribs in the middle or right side Headaches or seeing spots Feeling nauseated or throwing up Swelling in face and hands  Zone 3: EMERGENCY  Seek immediate medical care if you have any of the following:  BP reading is greater than160 (top number) or greater than 110 (bottom number) Severe headaches not improving with Tylenol Serious difficulty catching your breath Any worsening symptoms from Zone 2   Second Trimester of Pregnancy The second trimester is from week 13 through week 28, months 4 through 6. The second trimester is often a time when you feel your best. Your body has also adjusted to being pregnant, and you begin to feel better physically. Usually, morning sickness has lessened or quit completely, you may have more energy, and you may have an increase in appetite. The second trimester is also a time when the fetus is growing rapidly. At the end of the sixth month, the fetus is about 9 inches long and weighs about 1 pounds. You will likely begin to feel the baby move (quickening) between 18 and 20 weeks of the pregnancy. BODY CHANGES Your body goes through many changes during pregnancy. The changes vary from woman to woman.  Your weight will continue to increase. You will notice your lower abdomen bulging out. You may begin to get stretch marks on your hips, abdomen, and breasts. You may  develop headaches that can be relieved by medicines approved by your health care provider. You may urinate more often because the fetus is pressing on your bladder. You may develop or continue to have heartburn as a result of your pregnancy. You may develop constipation because certain hormones are causing the muscles that push waste through your intestines to slow down. You may develop hemorrhoids or swollen, bulging veins (varicose veins). You may have back pain because of the weight gain and pregnancy hormones relaxing your joints between the bones in your pelvis and as a result of a shift in weight and the muscles that support your balance. Your breasts will continue to grow and be tender. Your gums may bleed and may be sensitive to brushing and flossing. Dark spots or blotches (chloasma, mask of pregnancy) may develop on your face. This will likely fade after the baby is born. A dark line from your belly button to the pubic area (linea nigra) may appear. This will likely fade after the  baby is born. You may have changes in your hair. These can include thickening of your hair, rapid growth, and changes in texture. Some women also have hair loss during or after pregnancy, or hair that feels dry or thin. Your hair will most likely return to normal after your baby is born. WHAT TO EXPECT AT YOUR PRENATAL VISITS During a routine prenatal visit: You will be weighed to make sure you and the fetus are growing normally. Your blood pressure will be taken. Your abdomen will be measured to track your baby's growth. The fetal heartbeat will be listened to. Any test results from the previous visit will be discussed. Your health care provider may ask you: How you are feeling. If you are feeling the baby move. If you have had any abnormal symptoms, such as leaking fluid, bleeding, severe headaches, or abdominal cramping. If you have any questions. Other tests that may be performed during your second  trimester include: Blood tests that check for: Low iron levels (anemia). Gestational diabetes (between 24 and 28 weeks). Rh antibodies. Urine tests to check for infections, diabetes, or protein in the urine. An ultrasound to confirm the proper growth and development of the baby. An amniocentesis to check for possible genetic problems. Fetal screens for spina bifida and Down syndrome. HOME CARE INSTRUCTIONS  Avoid all smoking, herbs, alcohol, and unprescribed drugs. These chemicals affect the formation and growth of the baby. Follow your health care provider's instructions regarding medicine use. There are medicines that are either safe or unsafe to take during pregnancy. Exercise only as directed by your health care provider. Experiencing uterine cramps is a good sign to stop exercising. Continue to eat regular, healthy meals. Wear a good support bra for breast tenderness. Do not use hot tubs, steam rooms, or saunas. Wear your seat belt at all times when driving. Avoid raw meat, uncooked cheese, cat litter boxes, and soil used by cats. These carry germs that can cause birth defects in the baby. Take your prenatal vitamins. Try taking a stool softener (if your health care provider approves) if you develop constipation. Eat more high-fiber foods, such as fresh vegetables or fruit and whole grains. Drink plenty of fluids to keep your urine clear or pale yellow. Take warm sitz baths to soothe any pain or discomfort caused by hemorrhoids. Use hemorrhoid cream if your health care provider approves. If you develop varicose veins, wear support hose. Elevate your feet for 15 minutes, 3-4 times a day. Limit salt in your diet. Avoid heavy lifting, wear low heel shoes, and practice good posture. Rest with your legs elevated if you have leg cramps or low back pain. Visit your dentist if you have not gone yet during your pregnancy. Use a soft toothbrush to brush your teeth and be gentle when you floss. A  sexual relationship may be continued unless your health care provider directs you otherwise. Continue to go to all your prenatal visits as directed by your health care provider. SEEK MEDICAL CARE IF:  You have dizziness. You have mild pelvic cramps, pelvic pressure, or nagging pain in the abdominal area. You have persistent nausea, vomiting, or diarrhea. You have a bad smelling vaginal discharge. You have pain with urination. SEEK IMMEDIATE MEDICAL CARE IF:  You have a fever. You are leaking fluid from your vagina. You have spotting or bleeding from your vagina. You have severe abdominal cramping or pain. You have rapid weight gain or loss. You have shortness of breath with chest pain. You  notice sudden or extreme swelling of your face, hands, ankles, feet, or legs. You have not felt your baby move in over an hour. You have severe headaches that do not go away with medicine. You have vision changes. Document Released: 07/30/2001 Document Revised: 08/10/2013 Document Reviewed: 10/06/2012 [facility] Patient Information 2015 [place], [place]. This information is not intended to replace advice given to you by your health care provider. Make sure you discuss any questions you have with your health care provider.

## 2023-11-10 NOTE — Progress Notes (Signed)
 LOW-RISK PREGNANCY VISIT Patient name: Breanna Soto MRN [id number]  Date of birth: 2005-12-27 Chief Complaint:   Routine Prenatal Visit  History of Present Illness:   Breanna Soto is a 18 y.o. G18P0000 female at [redacted]w[redacted]d with an Estimated Date of Delivery: 02/29/24 being seen today for ongoing management of a low-risk pregnancy.   Today she reports no complaints. Contractions: Not present.  .  Movement: Present. denies leaking of fluid.     08/21/2023   10:41 AM 05/07/2023    9:33 AM  Depression screen PHQ 2/9  Decreased Interest 1 0  Down, Depressed, Hopeless 0 0  PHQ - 2 Score 1 0  Altered sleeping 2 3  Tired, decreased energy 2 3  Change in appetite 2 2  Feeling bad or failure about yourself  0 0  Trouble concentrating 0 0  Moving slowly or fidgety/restless 0 0  Suicidal thoughts 0 0  PHQ-9 Score 7 8        08/21/2023   10:42 AM 05/07/2023    9:34 AM  GAD 7 : Generalized Anxiety Score  Nervous, Anxious, on Edge 2 3  Control/stop worrying 2 3  Worry too much - different things 2 3  Trouble relaxing 1 3  Restless 0 1  Easily annoyed or irritable 1 0  Afraid - awful might happen 3 0  Total GAD 7 Score 11 13      Review of Systems:   Pertinent items are noted in HPI Denies abnormal vaginal discharge w/ itching/odor/irritation, headaches, visual changes, shortness of breath, chest pain, abdominal pain, severe nausea/vomiting, or problems with urination or bowel movements unless otherwise stated above. Pertinent History Reviewed:  Reviewed past medical,surgical, social, obstetrical and family history.  Reviewed problem list, medications and allergies. Physical Assessment:   Vitals:   11/10/23 0914  BP: 116/76  Pulse: (!) 106  Weight: 192 lb (87.1 kg)  Body mass index is 30.99 kg/m.        Physical Examination:   General appearance: Well appearing, and in no distress  Mental status: Alert, oriented to person, place, and time  Skin: Warm & dry  Cardiovascular:  Normal heart rate noted  Respiratory: Normal respiratory effort, no distress  Abdomen: Soft, gravid, nontender  Pelvic: Cervical exam deferred         Extremities: Edema: None  Fetal Status: Fetal Heart Rate (bpm): 143 Fundal Height: 23 cm Movement: Present    Chaperone: N/A No results found for this or any previous visit (from the past 24 hours).  Assessment & Plan:  1) Low-risk pregnancy G1P0000 at [redacted]w[redacted]d with an Estimated Date of Delivery: 02/29/24    Meds: No orders of the defined types were placed in this encounter.  Labs/procedures today: none  Plan:  Continue routine obstetrical care  Next visit: prefers will be in person for pn2     Reviewed: Preterm labor symptoms and general obstetric precautions including but not limited to vaginal bleeding, contractions, leaking of fluid and fetal movement were reviewed in detail with the patient.  All questions were answered. Does have home bp cuff. Office bp cuff given: not applicable. Check bp weekly, let us know if consistently >140 and/or >90.  Follow-up: Return in about 4 weeks (around 12/08/2023) for LROB, PN2, CNM, in person.  Future Appointments  Date Time Provider Department Center  12/08/2023  8:30 AM [facility]  12/08/2023  9:10 AM Cheral Marker, CNM [facility]    No orders of the  defined types were placed in this encounter.  Cheral Marker CNM, [facility] 11/10/2023 9:40 AM

## 2023-12-08 ENCOUNTER — Ambulatory Visit: Admitting: Women's Health

## 2023-12-08 ENCOUNTER — Encounter: Payer: Self-pay | Admitting: Women's Health

## 2023-12-08 ENCOUNTER — Other Ambulatory Visit

## 2023-12-08 VITALS — BP 118/79 | HR 94 | Wt 195.5 lb

## 2023-12-08 DIAGNOSIS — Z23 Encounter for immunization: Secondary | ICD-10-CM

## 2023-12-08 DIAGNOSIS — Z3A27 27 weeks gestation of pregnancy: Secondary | ICD-10-CM

## 2023-12-08 DIAGNOSIS — Z1332 Encounter for screening for maternal depression: Secondary | ICD-10-CM | POA: Diagnosis not present

## 2023-12-08 DIAGNOSIS — Z3A28 28 weeks gestation of pregnancy: Secondary | ICD-10-CM | POA: Diagnosis not present

## 2023-12-08 DIAGNOSIS — Z131 Encounter for screening for diabetes mellitus: Secondary | ICD-10-CM

## 2023-12-08 DIAGNOSIS — Z3403 Encounter for supervision of normal first pregnancy, third trimester: Secondary | ICD-10-CM

## 2023-12-08 NOTE — Progress Notes (Signed)
 LOW-RISK PREGNANCY VISIT Patient name: Breanna Soto MRN [id number]  Date of birth: 03-Oct-2005 Chief Complaint:   Routine Prenatal Visit (PN2 today!!!)  History of Present Illness:   Breanna Soto is a 18 y.o. G95P0000 female at [redacted]w[redacted]d with an Estimated Date of Delivery: 02/29/24 being seen today for ongoing management of a low-risk pregnancy.   Today she reports no complaints. Contractions: Not present. Vag. Bleeding: None.  Movement: Present. denies leaking of fluid.     12/08/2023    9:09 AM 08/21/2023   10:41 AM 05/07/2023    9:33 AM  Depression screen PHQ 2/9  Decreased Interest 0 1 0  Down, Depressed, Hopeless 0 0 0  PHQ - 2 Score 0 1 0  Altered sleeping 1 2 3   Tired, decreased energy 1 2 3   Change in appetite 0 2 2  Feeling bad or failure about yourself  0 0 0  Trouble concentrating 0 0 0  Moving slowly or fidgety/restless 0 0 0  Suicidal thoughts 0 0 0  PHQ-9 Score 2 7 8         12/08/2023    9:11 AM 08/21/2023   10:42 AM 05/07/2023    9:34 AM  GAD 7 : Generalized Anxiety Score  Nervous, Anxious, on Edge 0 2 3  Control/stop worrying 0 2 3  Worry too much - different things 1 2 3   Trouble relaxing 1 1 3   Restless 0 0 1  Easily annoyed or irritable 1 1 0  Afraid - awful might happen 0 3 0  Total GAD 7 Score 3 11 13       Review of Systems:   Pertinent items are noted in HPI Denies abnormal vaginal discharge w/ itching/odor/irritation, headaches, visual changes, shortness of breath, chest pain, abdominal pain, severe nausea/vomiting, or problems with urination or bowel movements unless otherwise stated above. Pertinent History Reviewed:  Reviewed past medical,surgical, social, obstetrical and family history.  Reviewed problem list, medications and allergies. Physical Assessment:   Vitals:   12/08/23 0907  BP: 118/79  Pulse: 94  Weight: 195 lb 8 oz (88.7 kg)  Body mass index is 31.55 kg/m.        Physical Examination:   General appearance: Well appearing, and in  no distress  Mental status: Alert, oriented to person, place, and time  Skin: Warm & dry  Cardiovascular: Normal heart rate noted  Respiratory: Normal respiratory effort, no distress  Abdomen: Soft, gravid, nontender  Pelvic: Cervical exam deferred         Extremities: Edema: Trace  Fetal Status: Fetal Heart Rate (bpm): 140 Fundal Height: 26 cm Movement: Present    Chaperone: N/A No results found for this or any previous visit (from the past 24 hours).  Assessment & Plan:  1) Low-risk pregnancy G1P0000 at [redacted]w[redacted]d with an Estimated Date of Delivery: 02/29/24    Meds: No orders of the defined types were placed in this encounter.  Labs/procedures today: Tdap and PN2  Plan:  Continue routine obstetrical care  Next visit: prefers in person    Reviewed: Preterm labor symptoms and general obstetric precautions including but not limited to vaginal bleeding, contractions, leaking of fluid and fetal movement were reviewed in detail with the patient.  All questions were answered. Does have home bp cuff. Office bp cuff given: not applicable. Check bp weekly, let us  know if consistently >140 and/or >90.  Follow-up: Return in about 2 weeks (around 12/22/2023) for LROB, CNM, in person.  No future appointments.  No orders of the defined types were placed in this encounter.  Ferd Householder CNM, [facility] 12/08/2023 9:36 AM

## 2023-12-08 NOTE — Patient Instructions (Signed)
 Breanna Soto, thank you for choosing our office today! We appreciate the opportunity to meet your healthcare needs. You may receive a short survey by mail, e-mail, or through [employer]. If you are happy with your care we would appreciate if you could take just a few minutes to complete the survey questions. We read all of your comments and take your feedback very seriously. Thank you again for choosing our office.  Center for [employer] Team at [facility]  [facility] at [facility] ([address] [place], [place] [address]) Entrance C, located off of [address] Free 24/7 valet parking   CLASSES: Go to [email or web address] to register for classes (childbirth, breastfeeding, waterbirth, infant CPR, daddy bootcamp, etc.)  Call the office [phone]) or go to [facility] if: You begin to have strong, frequent contractions Your water breaks.  Sometimes it is a big gush of fluid, sometimes it is just a trickle that keeps getting your panties wet or running down your legs You have vaginal bleeding.  It is normal to have a small amount of spotting if your cervix was checked.  You don't feel your baby moving like normal.  If you don't, get you something to eat and drink and lay down and focus on feeling your baby move.   If your baby is still not moving like normal, you should call the office or go to [facility]  Call the office [phone]) or go to [facility] hospital for these signs of pre-eclampsia: Severe headache that does not go away with Tylenol  Visual changes- seeing spots, double, blurred vision Pain under your right breast or upper abdomen that does not go away with Tums or heartburn medicine Nausea and/or vomiting Severe swelling in your hands, feet, and face   Tdap Vaccine It is recommended that you get the Tdap vaccine during the third trimester of EACH pregnancy to help protect your baby from getting pertussis (whooping cough) 27-36 weeks is the BEST time to do  this so that you can pass the protection on to your baby. During pregnancy is better than after pregnancy, but if you are unable to get it during pregnancy it will be offered at the hospital.  You can get this vaccine with us , at the health department, your family doctor, or some local pharmacies Everyone who will be around your baby should also be up-to-date on their vaccines before the baby comes. Adults (who are not pregnant) only need 1 dose of Tdap during adulthood.   [facility] Pediatricians/Family Doctors [facility]): [address] [place], [phone]           [facility]: [address], [phone]                [facility]): [address], [phone] (call to ask if accepting patients) [facility] Department: [address], [place], [phone]    [facility] Pediatricians/Family Doctors [facility]): [phone] [address], [phone] [facility]: [address] [place], [phone] [facility]: [address], [phone]  [facility] Doctors  [address] [facility]): [phone] [facility]: [address] [phone]   [facility] Doctors [facility]: [address] [phone]  [facility] Family Doctors  [facility]: [phone], [phone]  Home Blood Pressure Monitoring for Patients   Your provider has recommended that you check your  blood pressure (BP) at least once a week at home. If you do not have a blood pressure cuff at home, one will be provided for you. Contact your provider if you have not received your monitor within 1 week.   Helpful Tips for Accurate Home Blood Pressure Checks  Don't smoke, exercise, or drink caffeine 30 minutes before checking your BP Use the restroom before checking your BP (a full bladder can raise your  pressure) Relax in a comfortable upright chair Feet on the ground Left arm resting comfortably on a flat surface at the level of your heart Legs uncrossed Back supported Sit quietly and don't talk Place the cuff on your bare arm Adjust snuggly, so that only two fingertips can fit between your skin and the top of the cuff Check 2 readings separated by at least one minute Keep a log of your BP readings For a visual, please reference this diagram: http://ccnc.care/bpdiagram  Provider Name: [facility]     Phone: [phone]  Zone 1: ALL CLEAR  Continue to monitor your symptoms:  BP reading is less than 140 (top number) or less than 90 (bottom number)  No right upper stomach pain No headaches or seeing spots No feeling nauseated or throwing up No swelling in face and hands  Zone 2: CAUTION Call your doctor's office for any of the following:  BP reading is greater than 140 (top number) or greater than 90 (bottom number)  Stomach pain under your ribs in the middle or right side Headaches or seeing spots Feeling nauseated or throwing up Swelling in face and hands  Zone 3: EMERGENCY  Seek immediate medical care if you have any of the following:  BP reading is greater than160 (top number) or greater than 110 (bottom number) Severe headaches not improving with Tylenol  Serious difficulty catching your breath Any worsening symptoms from Zone 2   Third Trimester of Pregnancy The third trimester is from week 29 through week 42, months 7 through 9. The third trimester is a time when the fetus is growing rapidly. At the end of the ninth month, the fetus is about 20 inches in length and weighs 6-10 pounds.  BODY CHANGES Your body goes through many changes during pregnancy. The changes vary from woman to woman.  Your weight will continue to increase. You can expect to gain 25-35 pounds (11-16 kg) by the end of the pregnancy. You may begin to get stretch marks on your hips, abdomen,  and breasts. You may urinate more often because the fetus is moving lower into your pelvis and pressing on your bladder. You may develop or continue to have heartburn as a result of your pregnancy. You may develop constipation because certain hormones are causing the muscles that push waste through your intestines to slow down. You may develop hemorrhoids or swollen, bulging veins (varicose veins). You may have pelvic pain because of the weight gain and pregnancy hormones relaxing your joints between the bones in your pelvis. Backaches may result from overexertion of the muscles supporting your posture. You may have changes in your hair. These can include thickening of your hair, rapid growth, and changes in texture. Some women also have hair loss during or after pregnancy, or hair that feels dry or thin. Your hair will most likely return to normal after your baby is born. Your breasts will continue to grow and be tender. A yellow discharge may leak from your breasts called colostrum. Your belly button may stick out. You may  feel short of breath because of your expanding uterus. You may notice the fetus "dropping," or moving lower in your abdomen. You may have a bloody mucus discharge. This usually occurs a few days to a week before labor begins. Your cervix becomes thin and soft (effaced) near your due date. WHAT TO EXPECT AT YOUR PRENATAL EXAMS  You will have prenatal exams every 2 weeks until week 36. Then, you will have weekly prenatal exams. During a routine prenatal visit: You will be weighed to make sure you and the fetus are growing normally. Your blood pressure is taken. Your abdomen will be measured to track your baby's growth. The fetal heartbeat will be listened to. Any test results from the previous visit will be discussed. You may have a cervical check near your due date to see if you have effaced. At around 36 weeks, your caregiver will check your cervix. At the same time, your  caregiver will also perform a test on the secretions of the vaginal tissue. This test is to determine if a type of bacteria, Group B streptococcus, is present. Your caregiver will explain this further. Your caregiver may ask you: What your birth plan is. How you are feeling. If you are feeling the baby move. If you have had any abnormal symptoms, such as leaking fluid, bleeding, severe headaches, or abdominal cramping. If you have any questions. Other tests or screenings that may be performed during your third trimester include: Blood tests that check for low iron levels (anemia). Fetal testing to check the health, activity level, and growth of the fetus. Testing is done if you have certain medical conditions or if there are problems during the pregnancy. FALSE LABOR You may feel small, irregular contractions that eventually go away. These are called Braxton Hicks contractions, or false labor. Contractions may last for hours, days, or even weeks before true labor sets in. If contractions come at regular intervals, intensify, or become painful, it is best to be seen by your caregiver.  SIGNS OF LABOR  Menstrual-like cramps. Contractions that are 5 minutes apart or less. Contractions that start on the top of the uterus and spread down to the lower abdomen and back. A sense of increased pelvic pressure or back pain. A watery or bloody mucus discharge that comes from the vagina. If you have any of these signs before the 37th week of pregnancy, call your caregiver right away. You need to go to the hospital to get checked immediately. HOME CARE INSTRUCTIONS  Avoid all smoking, herbs, alcohol, and unprescribed drugs. These chemicals affect the formation and growth of the baby. Follow your caregiver's instructions regarding medicine use. There are medicines that are either safe or unsafe to take during pregnancy. Exercise only as directed by your caregiver. Experiencing uterine cramps is a good sign to  stop exercising. Continue to eat regular, healthy meals. Wear a good support bra for breast tenderness. Do not use hot tubs, steam rooms, or saunas. Wear your seat belt at all times when driving. Avoid raw meat, uncooked cheese, cat litter boxes, and soil used by cats. These carry germs that can cause birth defects in the baby. Take your prenatal vitamins. Try taking a stool softener (if your caregiver approves) if you develop constipation. Eat more high-fiber foods, such as fresh vegetables or fruit and whole grains. Drink plenty of fluids to keep your urine clear or pale yellow. Take warm sitz baths to soothe any pain or discomfort caused by hemorrhoids. Use hemorrhoid cream if  your caregiver approves. If you develop varicose veins, wear support hose. Elevate your feet for 15 minutes, 3-4 times a day. Limit salt in your diet. Avoid heavy lifting, wear low heal shoes, and practice good posture. Rest a lot with your legs elevated if you have leg cramps or low back pain. Visit your dentist if you have not gone during your pregnancy. Use a soft toothbrush to brush your teeth and be gentle when you floss. A sexual relationship may be continued unless your caregiver directs you otherwise. Do not travel far distances unless it is absolutely necessary and only with the approval of your caregiver. Take prenatal classes to understand, practice, and ask questions about the labor and delivery. Make a trial run to the hospital. Pack your hospital bag. Prepare the baby's nursery. Continue to go to all your prenatal visits as directed by your caregiver. SEEK MEDICAL CARE IF: You are unsure if you are in labor or if your water has broken. You have dizziness. You have mild pelvic cramps, pelvic pressure, or nagging pain in your abdominal area. You have persistent nausea, vomiting, or diarrhea. You have a bad smelling vaginal discharge. You have pain with urination. SEEK IMMEDIATE MEDICAL CARE IF:  You  have a fever. You are leaking fluid from your vagina. You have spotting or bleeding from your vagina. You have severe abdominal cramping or pain. You have rapid weight loss or gain. You have shortness of breath with chest pain. You notice sudden or extreme swelling of your face, hands, ankles, feet, or legs. You have not felt your baby move in over an hour. You have severe headaches that do not go away with medicine. You have vision changes. Document Released: 07/30/2001 Document Revised: 08/10/2013 Document Reviewed: 10/06/2012 [facility] Patient Information 2015 [place], [place]. This information is not intended to replace advice given to you by your health care provider. Make sure you discuss any questions you have with your health care provider.

## 2023-12-08 NOTE — Addendum Note (Signed)
 Addended by: Kerrie Peek on: 12/08/2023 09:38 AM   Modules accepted: Orders

## 2023-12-09 ENCOUNTER — Encounter: Payer: Self-pay | Admitting: Women's Health

## 2023-12-09 LAB — CBC
Hematocrit: 40.1 % (ref 34.0–46.6)
Hemoglobin: 13.4 g/dL (ref 11.1–15.9)
MCH: 28.3 pg (ref 26.6–33.0)
MCHC: 33.4 g/dL (ref 31.5–35.7)
MCV: 85 fL (ref 79–97)
Platelets: 172 10*3/uL (ref 150–450)
RBC: 4.74 x10E6/uL (ref 3.77–5.28)
RDW: 13.3 % (ref 11.7–15.4)
WBC: 8.5 10*3/uL (ref 3.4–10.8)

## 2023-12-09 LAB — RPR: RPR Ser Ql: NONREACTIVE

## 2023-12-09 LAB — ANTIBODY SCREEN: Antibody Screen: NEGATIVE

## 2023-12-09 LAB — HIV ANTIBODY (ROUTINE TESTING W REFLEX): HIV Screen 4th Generation wRfx: NONREACTIVE

## 2023-12-09 LAB — GLUCOSE TOLERANCE, 2 HOURS W/ 1HR
Glucose, 1 hour: 80 mg/dL (ref 70–179)
Glucose, 2 hour: 75 mg/dL (ref 70–152)
Glucose, Fasting: 72 mg/dL (ref 70–91)

## 2023-12-22 ENCOUNTER — Ambulatory Visit: Admitting: Obstetrics and Gynecology

## 2023-12-22 VITALS — BP 126/74 | HR 68 | Wt 199.0 lb

## 2023-12-22 DIAGNOSIS — K649 Unspecified hemorrhoids: Secondary | ICD-10-CM | POA: Diagnosis not present

## 2023-12-22 DIAGNOSIS — K5900 Constipation, unspecified: Secondary | ICD-10-CM | POA: Diagnosis not present

## 2023-12-22 DIAGNOSIS — Z3A30 30 weeks gestation of pregnancy: Secondary | ICD-10-CM | POA: Diagnosis not present

## 2023-12-22 DIAGNOSIS — Z3403 Encounter for supervision of normal first pregnancy, third trimester: Secondary | ICD-10-CM | POA: Diagnosis not present

## 2023-12-22 MED ORDER — DOCUSATE SODIUM 100 MG PO CAPS: 100.0000 mg | ORAL_CAPSULE | Freq: Two times a day (BID) | ORAL | 0 refills | Status: DC

## 2023-12-22 MED ORDER — HYDROCORTISONE (PERIANAL) 2.5 % EX CREA: TOPICAL_CREAM | Freq: Two times a day (BID) | CUTANEOUS | 2 refills | Status: DC

## 2023-12-22 NOTE — Progress Notes (Signed)
   PRENATAL VISIT NOTE  Subjective:  Breanna Soto is a 18 y.o. G1P0000 at [redacted]w[redacted]d being seen today for ongoing prenatal care.  She is currently monitored for the following issues for this low-risk pregnancy and has Supervision of normal first pregnancy and Abnormal chromosomal and genetic finding on antenatal screening mother on their problem list.  Patient reports constipation and hemorrhoid .  Contractions: Not present. Vag. Bleeding: None.  Movement: Present. Denies leaking of fluid.   The following portions of the patient's history were reviewed and updated as appropriate: allergies, current medications, past family history, past medical history, past social history, past surgical history and problem list.   Objective:   Vitals:   12/22/23 0906  BP: 126/74  Pulse: 68  Weight: 199 lb (90.3 kg)    Fetal Status: Fetal Heart Rate (bpm): 136 Fundal Height: 29 cm Movement: Present     General:  Alert, oriented and cooperative. Patient is in no acute distress.  Skin: Skin is warm and dry. No rash noted.   Cardiovascular: Normal heart rate noted  Respiratory: Normal respiratory effort, no problems with respiration noted  Abdomen: Soft, gravid, appropriate for gestational age.  Pain/Pressure: Absent     Pelvic: Cervical exam deferred        Extremities: Normal range of motion.     Mental Status: Normal mood and affect. Normal behavior. Normal judgment and thought content.   Assessment and Plan:  Pregnancy: G1P0000 at [redacted]w[redacted]d 1. Encounter for supervision of normal first pregnancy in third trimester (Primary) BP and FHR normal Doing well, feeling regular movement  FH appropriate   2. [redacted] weeks gestation of pregnancy Peds list given today   3. Hemorrhoids, unspecified hemorrhoid type  - hydrocortisone (ANUSOL-HC) 2.5 % rectal cream; Place rectally 2 (two) times daily.  Dispense: 30 g; Refill: 2  4. Constipation, unspecified constipation type Encouraged hydration, prune juice, stool  softener  - docusate sodium (COLACE) 100 MG capsule; Take 1 capsule (100 mg total) by mouth 2 (two) times daily.  Dispense: 10 capsule; Refill: 0   Preterm labor symptoms and general obstetric precautions including but not limited to vaginal bleeding, contractions, leaking of fluid and fetal movement were reviewed in detail with the patient. Please refer to After Visit Summary for other counseling recommendations.   Return in about 2 weeks (around 01/05/2024), or LOB.    Susi Eric, FNP

## 2024-01-05 ENCOUNTER — Ambulatory Visit: Admitting: Women's Health

## 2024-01-05 ENCOUNTER — Encounter: Payer: Self-pay | Admitting: Women's Health

## 2024-01-05 VITALS — BP 127/76 | HR 82 | Wt 200.0 lb

## 2024-01-05 DIAGNOSIS — Z3A32 32 weeks gestation of pregnancy: Secondary | ICD-10-CM | POA: Diagnosis not present

## 2024-01-05 DIAGNOSIS — Z3403 Encounter for supervision of normal first pregnancy, third trimester: Secondary | ICD-10-CM

## 2024-01-05 NOTE — Progress Notes (Signed)
 LOW-RISK PREGNANCY VISIT Patient name: Breanna Soto MRN [id number]  Date of birth: [birth year] Chief Complaint:   Routine Prenatal Visit  History of Present Illness:   Breanna Soto is a 18 y.o. G2P0000 female at [redacted]w[redacted]d with an Estimated Date of Delivery: 02/29/24 being seen today for ongoing management of a low-risk pregnancy.   Today she reports no complaints. Contractions: Not present. Vag. Bleeding: None.  Movement: Present. denies leaking of fluid.     12/08/2023    9:09 AM 08/21/2023   10:41 AM 05/07/2023    9:33 AM  Depression screen PHQ 2/9  Decreased Interest 0 1 0  Down, Depressed, Hopeless 0 0 0  PHQ - 2 Score 0 1 0  Altered sleeping 1 2 3   Tired, decreased energy 1 2 3   Change in appetite 0 2 2  Feeling bad or failure about yourself  0 0 0  Trouble concentrating 0 0 0  Moving slowly or fidgety/restless 0 0 0  Suicidal thoughts 0 0 0  PHQ-9 Score 2 7 8         12/08/2023    9:11 AM 08/21/2023   10:42 AM 05/07/2023    9:34 AM  GAD 7 : Generalized Anxiety Score  Nervous, Anxious, on Edge 0 2 3  Control/stop worrying 0 2 3  Worry too much - different things 1 2 3   Trouble relaxing 1 1 3   Restless 0 0 1  Easily annoyed or irritable 1 1 0  Afraid - awful might happen 0 3 0  Total GAD 7 Score 3 11 13       Review of Systems:   Pertinent items are noted in HPI Denies abnormal vaginal discharge w/ itching/odor/irritation, headaches, visual changes, shortness of breath, chest pain, abdominal pain, severe nausea/vomiting, or problems with urination or bowel movements unless otherwise stated above. Pertinent History Reviewed:  Reviewed past medical,surgical, social, obstetrical and family history.  Reviewed problem list, medications and allergies. Physical Assessment:   Vitals:   01/05/24 1007  BP: 127/76  Pulse: 82  Weight: 200 lb (90.7 kg)  Body mass index is 32.28 kg/m.        Physical Examination:   General appearance: Well appearing, and in no  distress  Mental status: Alert, oriented to person, place, and time  Skin: Warm & dry  Cardiovascular: Normal heart rate noted  Respiratory: Normal respiratory effort, no distress  Abdomen: Soft, gravid, nontender  Pelvic: Cervical exam deferred         Extremities: Edema: None  Fetal Status: Fetal Heart Rate (bpm): 130 Fundal Height: 30 cm Movement: Present    Chaperone: N/A No results found for this or any previous visit (from the past 24 hours).  Assessment & Plan:  1) Low-risk pregnancy G1P0000 at [redacted]w[redacted]d with an Estimated Date of Delivery: 02/29/24    Meds: No orders of the defined types were placed in this encounter.  Labs/procedures today: none  Plan:  Continue routine obstetrical care  Next visit: prefers in person    Reviewed: Preterm labor symptoms and general obstetric precautions including but not limited to vaginal bleeding, contractions, leaking of fluid and fetal movement were reviewed in detail with the patient.  All questions were answered. Does have home bp cuff. Office bp cuff given: not applicable. Check bp weekly, let us  know if consistently >140 and/or >90.  Follow-up: Return in about 2 weeks (around 01/19/2024) for LROB, CNM, in person.  No future appointments.  No orders of the defined types  were placed in this encounter.  Ferd Householder CNM, [facility] 01/05/2024 10:23 AM

## 2024-01-05 NOTE — Patient Instructions (Signed)
Bernerd Pho, thank you for choosing our office today! We appreciate the opportunity to meet your healthcare needs. You may receive a short survey by mail, e-mail, or through [employer]. If you are happy with your care we would appreciate if you could take just a few minutes to complete the survey questions. We read all of your comments and take your feedback very seriously. Thank you again for choosing our office.  Center for [employer] Team at [facility]  [facility] at [facility] ([address] [place], [place] [address]) Entrance C, located off of [address] Free 24/7 valet parking   CLASSES: Go to [email or web address] to register for classes (childbirth, breastfeeding, waterbirth, infant CPR, daddy bootcamp, etc.)  Call the office [phone]) or go to [facility] if: You begin to have strong, frequent contractions Your water breaks.  Sometimes it is a big gush of fluid, sometimes it is just a trickle that keeps getting your panties wet or running down your legs You have vaginal bleeding.  It is normal to have a small amount of spotting if your cervix was checked.  You don't feel your baby moving like normal.  If you don't, get you something to eat and drink and lay down and focus on feeling your baby move.   If your baby is still not moving like normal, you should call the office or go to [facility].  Call the office [phone]) or go to [facility] hospital for these signs of pre-eclampsia: Severe headache that does not go away with Tylenol Visual changes- seeing spots, double, blurred vision Pain under your right breast or upper abdomen that does not go away with Tums or heartburn medicine Nausea and/or vomiting Severe swelling in your hands, feet, and face   Tdap Vaccine It is recommended that you get the Tdap vaccine during the third trimester of EACH pregnancy to help protect your baby from getting pertussis (whooping cough) 27-36 weeks is the BEST time to do  this so that you can pass the protection on to your baby. During pregnancy is better than after pregnancy, but if you are unable to get it during pregnancy it will be offered at the hospital.  You can get this vaccine with [name], at the health department, your family doctor, or some local pharmacies Everyone who will be around your baby should also be up-to-date on their vaccines before the baby comes. Adults (who are not pregnant) only need 1 dose of Tdap during adulthood.   [facility] Pediatricians/Family Doctors [place]): [address] Colette Ribas, [phone]           [facility]: [address], [phone]                [facility]): [address], [phone] (call to ask if accepting patients) [facility]: [address], [place], [phone]    [facility] Pediatricians/Family Doctors [facility]): [phone] [address], [phone] [facility]: [address] [place], [phone] [facility]: [address], [phone]  [facility] Doctors  [facility]): [phone] [facility]: [address], [phone]   [facility] Doctors [facility]: [address], [phone]  [facility] Family Doctors  [facility]: [phone], [phone]  Home Blood Pressure Monitoring for Patients   Your provider has recommended that you check your  blood pressure (BP) at least once a week at home. If you do not have a blood pressure cuff at home, one will be provided for you. Contact your provider if you have not received your monitor within 1 week.   Helpful Tips for Accurate Home Blood Pressure Checks  Don't smoke, exercise, or drink caffeine 30 minutes before checking your BP Use the restroom before checking your BP (a full bladder can raise your  pressure) Relax in a comfortable upright chair Feet on the ground Left arm resting comfortably on a flat surface at the level of your heart Legs uncrossed Back supported Sit quietly and don't talk Place the cuff on your bare arm Adjust snuggly, so that only two fingertips can fit between your skin and the top of the cuff Check 2 readings separated by at least one minute Keep a log of your BP readings For a visual, please reference this diagram: http://ccnc.care/bpdiagram  Provider Name: [facility]     Phone: [phone]  Zone 1: ALL CLEAR  Continue to monitor your symptoms:  BP reading is less than 140 (top number) or less than 90 (bottom number)  No right upper stomach pain No headaches or seeing spots No feeling nauseated or throwing up No swelling in face and hands  Zone 2: CAUTION Call your doctor's office for any of the following:  BP reading is greater than 140 (top number) or greater than 90 (bottom number)  Stomach pain under your ribs in the middle or right side Headaches or seeing spots Feeling nauseated or throwing up Swelling in face and hands  Zone 3: EMERGENCY  Seek immediate medical care if you have any of the following:  BP reading is greater than160 (top number) or greater than 110 (bottom number) Severe headaches not improving with Tylenol Serious difficulty catching your breath Any worsening symptoms from Zone 2  Preterm Labor and Birth Information  The normal length of a pregnancy is 39-41 weeks. Preterm labor is when labor starts before 37 completed weeks of pregnancy. What are the risk factors for preterm labor? Preterm labor is more likely to occur in women who: Have certain infections during pregnancy such as a bladder infection, sexually transmitted infection, or infection inside the uterus (chorioamnionitis). Have a shorter-than-normal cervix. Have gone into preterm labor before. Have had surgery on their cervix. Are younger than age 17  or older than age 35. Are African American. Are pregnant with twins or multiple babies (multiple gestation). Take street drugs or smoke while pregnant. Do not gain enough weight while pregnant. Became pregnant shortly after having been pregnant. What are the symptoms of preterm labor? Symptoms of preterm labor include: Cramps similar to those that can happen during a menstrual period. The cramps may happen with diarrhea. Pain in the abdomen or lower back. Regular uterine contractions that may feel like tightening of the abdomen. A feeling of increased pressure in the pelvis. Increased watery or bloody mucus discharge from the vagina. Water breaking (ruptured amniotic sac). Why is it important to recognize signs of preterm labor? It is important to recognize signs of preterm labor because babies who are born prematurely may not be fully developed. This can put them at an increased risk for: Long-term (chronic) heart and lung problems. Difficulty immediately after birth with regulating body systems, including blood sugar, body temperature, heart rate, and breathing rate. Bleeding in the brain. Cerebral palsy. Learning difficulties. Death. These risks are highest for babies who are born before 34 weeks   of pregnancy. How is preterm labor treated? Treatment depends on the length of your pregnancy, your condition, and the health of your baby. It may involve: Having a stitch (suture) placed in your cervix to prevent your cervix from opening too early (cerclage). Taking or being given medicines, such as: Hormone medicines. These may be given early in pregnancy to help support the pregnancy. Medicine to stop contractions. Medicines to help mature the baby's lungs. These may be prescribed if the risk of delivery is high. Medicines to prevent your baby from developing cerebral palsy. If the labor happens before 34 weeks of pregnancy, you may need to stay in the hospital. What should I do if I  think I am in preterm labor? If you think that you are going into preterm labor, call your health care provider right away. How can I prevent preterm labor in future pregnancies? To increase your chance of having a full-term pregnancy: Do not use any tobacco products, such as cigarettes, chewing tobacco, and e-cigarettes. If you need help quitting, ask your health care provider. Do not use street drugs or medicines that have not been prescribed to you during your pregnancy. Talk with your health care provider before taking any herbal supplements, even if you have been taking them regularly. Make sure you gain a healthy amount of weight during your pregnancy. Watch for infection. If you think that you might have an infection, get it checked right away. Make sure to tell your health care provider if you have gone into preterm labor before. This information is not intended to replace advice given to you by your health care provider. Make sure you discuss any questions you have with your health care provider. Document Revised: 11/27/2018 Document Reviewed: 12/27/2015 Elsevier Patient Education  2020 Elsevier Inc.   

## 2024-01-19 ENCOUNTER — Ambulatory Visit: Admitting: Women's Health

## 2024-01-19 ENCOUNTER — Encounter: Payer: Self-pay | Admitting: Women's Health

## 2024-01-19 VITALS — BP 128/75 | HR 76 | Wt 199.0 lb

## 2024-01-19 DIAGNOSIS — Z3403 Encounter for supervision of normal first pregnancy, third trimester: Secondary | ICD-10-CM

## 2024-01-19 DIAGNOSIS — Z3A34 34 weeks gestation of pregnancy: Secondary | ICD-10-CM | POA: Diagnosis not present

## 2024-01-19 NOTE — Progress Notes (Signed)
 LOW-RISK PREGNANCY VISIT Patient name: Breanna Soto MRN [id number]  Date of birth: [birth year] Chief Complaint:   Routine Prenatal Visit  History of Present Illness:   Breanna Soto is a 18 y.o. G37P0000 female at [redacted]w[redacted]d with an Estimated Date of Delivery: 02/29/24 being seen today for ongoing management of a low-risk pregnancy.   Today she reports no complaints. Contractions: Not present. Vag. Bleeding: None.  Movement: Present. denies leaking of fluid.     12/08/2023    9:09 AM 08/21/2023   10:41 AM 05/07/2023    9:33 AM  Depression screen PHQ 2/9  Decreased Interest 0 1 0  Down, Depressed, Hopeless 0 0 0  PHQ - 2 Score 0 1 0  Altered sleeping 1 2 3   Tired, decreased energy 1 2 3   Change in appetite 0 2 2  Feeling bad or failure about yourself  0 0 0  Trouble concentrating 0 0 0  Moving slowly or fidgety/restless 0 0 0  Suicidal thoughts 0 0 0  PHQ-9 Score 2 7 8         12/08/2023    9:11 AM 08/21/2023   10:42 AM 05/07/2023    9:34 AM  GAD 7 : Generalized Anxiety Score  Nervous, Anxious, on Edge 0 2 3  Control/stop worrying 0 2 3  Worry too much - different things 1 2 3   Trouble relaxing 1 1 3   Restless 0 0 1  Easily annoyed or irritable 1 1 0  Afraid - awful might happen 0 3 0  Total GAD 7 Score 3 11 13       Review of Systems:   Pertinent items are noted in HPI Denies abnormal vaginal discharge w/ itching/odor/irritation, headaches, visual changes, shortness of breath, chest pain, abdominal pain, severe nausea/vomiting, or problems with urination or bowel movements unless otherwise stated above. Pertinent History Reviewed:  Reviewed past medical,surgical, social, obstetrical and family history.  Reviewed problem list, medications and allergies. Physical Assessment:   Vitals:   01/19/24 1401  BP: 128/75  Pulse: 76  Weight: 199 lb (90.3 kg)  Body mass index is 32.12 kg/m.        Physical Examination:   General appearance: Well appearing, and in no  distress  Mental status: Alert, oriented to person, place, and time  Skin: Warm & dry  Cardiovascular: Normal heart rate noted  Respiratory: Normal respiratory effort, no distress  Abdomen: Soft, gravid, nontender  Pelvic: Cervical exam deferred         Extremities: Edema: None  Fetal Status: Fetal Heart Rate (bpm): 135 Fundal Height: 32 cm Movement: Present    Chaperone: N/A No results found for this or any previous visit (from the past 24 hours).  Assessment & Plan:  1) Low-risk pregnancy G1P0000 at [redacted]w[redacted]d with an Estimated Date of Delivery: 02/29/24     Meds: No orders of the defined types were placed in this encounter.  Labs/procedures today: none  Plan:  Continue routine obstetrical care  Next visit: prefers will be in person for cultures    Reviewed: Preterm labor symptoms and general obstetric precautions including but not limited to vaginal bleeding, contractions, leaking of fluid and fetal movement were reviewed in detail with the patient.  All questions were answered. Does have home bp cuff. Office bp cuff given: not applicable. Check bp weekly, let us  know if consistently >140 and/or >90.  Follow-up: Return in about 2 weeks (around 02/02/2024) for LROB, CNM, in person; then weekly thereafter.  No future  appointments.  No orders of the defined types were placed in this encounter.  Ferd Householder CNM, [facility] 01/19/2024 2:07 PM

## 2024-01-19 NOTE — Patient Instructions (Signed)
Bernerd Pho, thank you for choosing our office today! We appreciate the opportunity to meet your healthcare needs. You may receive a short survey by mail, e-mail, or through [employer]. If you are happy with your care we would appreciate if you could take just a few minutes to complete the survey questions. We read all of your comments and take your feedback very seriously. Thank you again for choosing our office.  Center for [employer] Team at [facility]  [facility] at [facility] ([address] [place], [place] [address]) Entrance C, located off of [address] Free 24/7 valet parking   CLASSES: Go to [email or web address] to register for classes (childbirth, breastfeeding, waterbirth, infant CPR, daddy bootcamp, etc.)  Call the office [phone]) or go to [facility] if: You begin to have strong, frequent contractions Your water breaks.  Sometimes it is a big gush of fluid, sometimes it is just a trickle that keeps getting your panties wet or running down your legs You have vaginal bleeding.  It is normal to have a small amount of spotting if your cervix was checked.  You don't feel your baby moving like normal.  If you don't, get you something to eat and drink and lay down and focus on feeling your baby move.   If your baby is still not moving like normal, you should call the office or go to [facility].  Call the office [phone]) or go to [facility] hospital for these signs of pre-eclampsia: Severe headache that does not go away with Tylenol Visual changes- seeing spots, double, blurred vision Pain under your right breast or upper abdomen that does not go away with Tums or heartburn medicine Nausea and/or vomiting Severe swelling in your hands, feet, and face   Tdap Vaccine It is recommended that you get the Tdap vaccine during the third trimester of EACH pregnancy to help protect your baby from getting pertussis (whooping cough) 27-36 weeks is the BEST time to do  this so that you can pass the protection on to your baby. During pregnancy is better than after pregnancy, but if you are unable to get it during pregnancy it will be offered at the hospital.  You can get this vaccine with [name], at the health department, your family doctor, or some local pharmacies Everyone who will be around your baby should also be up-to-date on their vaccines before the baby comes. Adults (who are not pregnant) only need 1 dose of Tdap during adulthood.   [facility] Pediatricians/Family Doctors [place]): [address] Colette Ribas, [phone]           [facility]: [address], [phone]                [facility]): [address], [phone] (call to ask if accepting patients) [facility]: [address], [place], [phone]    [facility] Pediatricians/Family Doctors [facility]): [phone] [address], [phone] [facility]: [address] [place], [phone] [facility]: [address], [phone]  [facility] Doctors  [facility]): [phone] [facility]: [address], [phone]   [facility] Doctors [facility]: [address], [phone]  [facility] Family Doctors  [facility]: [phone], [phone]  Home Blood Pressure Monitoring for Patients   Your provider has recommended that you check your  blood pressure (BP) at least once a week at home. If you do not have a blood pressure cuff at home, one will be provided for you. Contact your provider if you have not received your monitor within 1 week.   Helpful Tips for Accurate Home Blood Pressure Checks  Don't smoke, exercise, or drink caffeine 30 minutes before checking your BP Use the restroom before checking your BP (a full bladder can raise your  pressure) Relax in a comfortable upright chair Feet on the ground Left arm resting comfortably on a flat surface at the level of your heart Legs uncrossed Back supported Sit quietly and don't talk Place the cuff on your bare arm Adjust snuggly, so that only two fingertips can fit between your skin and the top of the cuff Check 2 readings separated by at least one minute Keep a log of your BP readings For a visual, please reference this diagram: http://ccnc.care/bpdiagram  Provider Name: [facility]     Phone: [phone]  Zone 1: ALL CLEAR  Continue to monitor your symptoms:  BP reading is less than 140 (top number) or less than 90 (bottom number)  No right upper stomach pain No headaches or seeing spots No feeling nauseated or throwing up No swelling in face and hands  Zone 2: CAUTION Call your doctor's office for any of the following:  BP reading is greater than 140 (top number) or greater than 90 (bottom number)  Stomach pain under your ribs in the middle or right side Headaches or seeing spots Feeling nauseated or throwing up Swelling in face and hands  Zone 3: EMERGENCY  Seek immediate medical care if you have any of the following:  BP reading is greater than160 (top number) or greater than 110 (bottom number) Severe headaches not improving with Tylenol Serious difficulty catching your breath Any worsening symptoms from Zone 2  Preterm Labor and Birth Information  The normal length of a pregnancy is 39-41 weeks. Preterm labor is when labor starts before 37 completed weeks of pregnancy. What are the risk factors for preterm labor? Preterm labor is more likely to occur in women who: Have certain infections during pregnancy such as a bladder infection, sexually transmitted infection, or infection inside the uterus (chorioamnionitis). Have a shorter-than-normal cervix. Have gone into preterm labor before. Have had surgery on their cervix. Are younger than age 17  or older than age 35. Are African American. Are pregnant with twins or multiple babies (multiple gestation). Take street drugs or smoke while pregnant. Do not gain enough weight while pregnant. Became pregnant shortly after having been pregnant. What are the symptoms of preterm labor? Symptoms of preterm labor include: Cramps similar to those that can happen during a menstrual period. The cramps may happen with diarrhea. Pain in the abdomen or lower back. Regular uterine contractions that may feel like tightening of the abdomen. A feeling of increased pressure in the pelvis. Increased watery or bloody mucus discharge from the vagina. Water breaking (ruptured amniotic sac). Why is it important to recognize signs of preterm labor? It is important to recognize signs of preterm labor because babies who are born prematurely may not be fully developed. This can put them at an increased risk for: Long-term (chronic) heart and lung problems. Difficulty immediately after birth with regulating body systems, including blood sugar, body temperature, heart rate, and breathing rate. Bleeding in the brain. Cerebral palsy. Learning difficulties. Death. These risks are highest for babies who are born before 34 weeks   of pregnancy. How is preterm labor treated? Treatment depends on the length of your pregnancy, your condition, and the health of your baby. It may involve: Having a stitch (suture) placed in your cervix to prevent your cervix from opening too early (cerclage). Taking or being given medicines, such as: Hormone medicines. These may be given early in pregnancy to help support the pregnancy. Medicine to stop contractions. Medicines to help mature the baby's lungs. These may be prescribed if the risk of delivery is high. Medicines to prevent your baby from developing cerebral palsy. If the labor happens before 34 weeks of pregnancy, you may need to stay in the hospital. What should I do if I  think I am in preterm labor? If you think that you are going into preterm labor, call your health care provider right away. How can I prevent preterm labor in future pregnancies? To increase your chance of having a full-term pregnancy: Do not use any tobacco products, such as cigarettes, chewing tobacco, and e-cigarettes. If you need help quitting, ask your health care provider. Do not use street drugs or medicines that have not been prescribed to you during your pregnancy. Talk with your health care provider before taking any herbal supplements, even if you have been taking them regularly. Make sure you gain a healthy amount of weight during your pregnancy. Watch for infection. If you think that you might have an infection, get it checked right away. Make sure to tell your health care provider if you have gone into preterm labor before. This information is not intended to replace advice given to you by your health care provider. Make sure you discuss any questions you have with your health care provider. Document Revised: 11/27/2018 Document Reviewed: 12/27/2015 Elsevier Patient Education  2020 Elsevier Inc.   

## 2024-02-02 ENCOUNTER — Other Ambulatory Visit (HOSPITAL_COMMUNITY)
Admission: RE | Admit: 2024-02-02 | Discharge: 2024-02-02 | Disposition: A | Discharge status: Home / Self Care | Source: Ambulatory Visit | Attending: Women's Health | Admitting: Women's Health

## 2024-02-02 ENCOUNTER — Ambulatory Visit: Admitting: Women's Health

## 2024-02-02 ENCOUNTER — Encounter: Payer: Self-pay | Admitting: Women's Health

## 2024-02-02 VITALS — BP 134/82 | HR 76 | Wt 208.0 lb

## 2024-02-02 DIAGNOSIS — O09523 Supervision of elderly multigravida, third trimester: Secondary | ICD-10-CM | POA: Diagnosis not present

## 2024-02-02 DIAGNOSIS — Z3A36 36 weeks gestation of pregnancy: Secondary | ICD-10-CM | POA: Diagnosis not present

## 2024-02-02 DIAGNOSIS — Z3403 Encounter for supervision of normal first pregnancy, third trimester: Secondary | ICD-10-CM

## 2024-02-02 NOTE — Patient Instructions (Signed)
Breanna Soto, thank you for choosing our office today! We appreciate the opportunity to meet your healthcare needs. You may receive a short survey by mail, e-mail, or through [employer]. If you are happy with your care we would appreciate if you could take just a few minutes to complete the survey questions. We read all of your comments and take your feedback very seriously. Thank you again for choosing our office.  Center for [employer] Team at [facility]  [facility] ([address] [place], [place] [address]) Entrance C, located off of [address] Free 24/7 valet parking   CLASSES: Go to [email or web address] to register for classes (childbirth, breastfeeding, waterbirth, infant CPR, daddy bootcamp, etc.)  Call the office [phone]) or go to [facility] if: You begin to have strong, frequent contractions Your water breaks.  Sometimes it is a big gush of fluid, sometimes it is just a trickle that keeps getting your panties wet or running down your legs You have vaginal bleeding.  It is normal to have a small amount of spotting if your cervix was checked.  You don't feel your baby moving like normal.  If you don't, get you something to eat and drink and lay down and focus on feeling your baby move.   If your baby is still not moving like normal, you should call the office or go to [facility].  Call the office [phone]) or go to [facility] hospital for these signs of pre-eclampsia: Severe headache that does not go away with Tylenol Visual changes- seeing spots, double, blurred vision Pain under your right breast or upper abdomen that does not go away with Tums or heartburn medicine Nausea and/or vomiting Severe swelling in your hands, feet, and face   Tdap Vaccine It is recommended that you get the Tdap vaccine during the third trimester of EACH pregnancy to help protect your baby from getting pertussis (whooping cough) 27-36 weeks is the BEST time to do  this so that you can pass the protection on to your baby. During pregnancy is better than after pregnancy, but if you are unable to get it during pregnancy it will be offered at the hospital.  You can get this vaccine with [name], at the health department, your family doctor, or some local pharmacies Everyone who will be around your baby should also be up-to-date on their vaccines before the baby comes. Adults (who are not pregnant) only need 1 dose of Tdap during adulthood.   [facility] Pediatricians/Family Doctors [place] [facility]): [address] Colette Ribas, [phone]           [facility]: [address], [phone]                [facility] Medicine [facility]): [address], [phone] (call to ask if accepting patients) [facility]: [address], [place], [phone]    [facility] Pediatricians/Family Doctors [facility]): [phone] [address], [phone] [facility]: [address] [place], [phone] [facility]: [address], [phone]  [facility] Doctors  [facility]): [phone] [facility]: [address], [phone]   [facility] Doctors [facility]: [address], [phone]  [facility] Family Doctors  [facility]: [phone], [phone]  Home Blood Pressure Monitoring for Patients   Your provider has recommended that you check your  blood pressure (BP) at least once a week at home. If you do not have a blood pressure cuff at home, one will be provided for you. Contact your provider if you have not received your monitor within 1 week.   Helpful Tips for Accurate Home Blood Pressure Checks  Don't smoke, exercise, or drink caffeine 30 minutes before checking your BP Use the restroom before checking your BP (a full bladder can raise your  pressure) Relax in a comfortable upright chair Feet on the ground Left arm resting comfortably on a flat surface at the level of your heart Legs uncrossed Back supported Sit quietly and don't talk Place the cuff on your bare arm Adjust snuggly, so that only two fingertips can fit between your skin and the top of the cuff Check 2 readings separated by at least one minute Keep a log of your BP readings For a visual, please reference this diagram: http://ccnc.care/bpdiagram  Provider Name: [facility]     Phone: [phone]  Zone 1: ALL CLEAR  Continue to monitor your symptoms:  BP reading is less than 140 (top number) or less than 90 (bottom number)  No right upper stomach pain No headaches or seeing spots No feeling nauseated or throwing up No swelling in face and hands  Zone 2: CAUTION Call your doctor's office for any of the following:  BP reading is greater than 140 (top number) or greater than 90 (bottom number)  Stomach pain under your ribs in the middle or right side Headaches or seeing spots Feeling nauseated or throwing up Swelling in face and hands  Zone 3: EMERGENCY  Seek immediate medical care if you have any of the following:  BP reading is greater than160 (top number) or greater than 110 (bottom number) Severe headaches not improving with Tylenol Serious difficulty catching your breath Any worsening symptoms from Zone 2  Preterm Labor and Birth Information  The normal length of a pregnancy is 39-41 weeks. Preterm labor is when labor starts before 37 completed weeks of pregnancy. What are the risk factors for preterm labor? Preterm labor is more likely to occur in women who: Have certain infections during pregnancy such as a bladder infection, sexually transmitted infection, or infection inside the uterus (chorioamnionitis). Have a shorter-than-normal cervix. Have gone into preterm labor before. Have had surgery on their cervix. Are younger than age 17  or older than age 35. Are African American. Are pregnant with twins or multiple babies (multiple gestation). Take street drugs or smoke while pregnant. Do not gain enough weight while pregnant. Became pregnant shortly after having been pregnant. What are the symptoms of preterm labor? Symptoms of preterm labor include: Cramps similar to those that can happen during a menstrual period. The cramps may happen with diarrhea. Pain in the abdomen or lower back. Regular uterine contractions that may feel like tightening of the abdomen. A feeling of increased pressure in the pelvis. Increased watery or bloody mucus discharge from the vagina. Water breaking (ruptured amniotic sac). Why is it important to recognize signs of preterm labor? It is important to recognize signs of preterm labor because babies who are born prematurely may not be fully developed. This can put them at an increased risk for: Long-term (chronic) heart and lung problems. Difficulty immediately after birth with regulating body systems, including blood sugar, body temperature, heart rate, and breathing rate. Bleeding in the brain. Cerebral palsy. Learning difficulties. Death. These risks are highest for babies who are born before 34 weeks   of pregnancy. How is preterm labor treated? Treatment depends on the length of your pregnancy, your condition, and the health of your baby. It may involve: Having a stitch (suture) placed in your cervix to prevent your cervix from opening too early (cerclage). Taking or being given medicines, such as: Hormone medicines. These may be given early in pregnancy to help support the pregnancy. Medicine to stop contractions. Medicines to help mature the baby's lungs. These may be prescribed if the risk of delivery is high. Medicines to prevent your baby from developing cerebral palsy. If the labor happens before 34 weeks of pregnancy, you may need to stay in the hospital. What should I do if I  think I am in preterm labor? If you think that you are going into preterm labor, call your health care provider right away. How can I prevent preterm labor in future pregnancies? To increase your chance of having a full-term pregnancy: Do not use any tobacco products, such as cigarettes, chewing tobacco, and e-cigarettes. If you need help quitting, ask your health care provider. Do not use street drugs or medicines that have not been prescribed to you during your pregnancy. Talk with your health care provider before taking any herbal supplements, even if you have been taking them regularly. Make sure you gain a healthy amount of weight during your pregnancy. Watch for infection. If you think that you might have an infection, get it checked right away. Make sure to tell your health care provider if you have gone into preterm labor before. This information is not intended to replace advice given to you by your health care provider. Make sure you discuss any questions you have with your health care provider. Document Revised: 11/27/2018 Document Reviewed: 12/27/2015 Elsevier Patient Education  2020 Elsevier Inc.   

## 2024-02-02 NOTE — Progress Notes (Signed)
 LOW-RISK PREGNANCY VISIT Patient name: Breanna Soto MRN [id number]  Date of birth: 24-Jul-2006 Chief Complaint:   Routine Prenatal Visit (culture)  History of Present Illness:   Breanna Soto is a 18 y.o. G93P0000 female at [redacted]w[redacted]d with an Estimated Date of Delivery: 02/29/24 being seen today for ongoing management of a low-risk pregnancy.   Today she reports no complaints. Contractions: Not present.  .  Movement: Present. denies leaking of fluid.     12/08/2023    9:09 AM 08/21/2023   10:41 AM 05/07/2023    9:33 AM  Depression screen PHQ 2/9  Decreased Interest 0 1 0  Down, Depressed, Hopeless 0 0 0  PHQ - 2 Score 0 1 0  Altered sleeping 1 2 3   Tired, decreased energy 1 2 3   Change in appetite 0 2 2  Feeling bad or failure about yourself  0 0 0  Trouble concentrating 0 0 0  Moving slowly or fidgety/restless 0 0 0  Suicidal thoughts 0 0 0  PHQ-9 Score 2 7 8         12/08/2023    9:11 AM 08/21/2023   10:42 AM 05/07/2023    9:34 AM  GAD 7 : Generalized Anxiety Score  Nervous, Anxious, on Edge 0 2 3  Control/stop worrying 0 2 3  Worry too much - different things 1 2 3   Trouble relaxing 1 1 3   Restless 0 0 1  Easily annoyed or irritable 1 1 0  Afraid - awful might happen 0 3 0  Total GAD 7 Score 3 11 13       Review of Systems:   Pertinent items are noted in HPI Denies abnormal vaginal discharge w/ itching/odor/irritation, headaches, visual changes, shortness of breath, chest pain, abdominal pain, severe nausea/vomiting, or problems with urination or bowel movements unless otherwise stated above. Pertinent History Reviewed:  Reviewed past medical,surgical, social, obstetrical and family history.  Reviewed problem list, medications and allergies. Physical Assessment:   Vitals:   02/02/24 1411  BP: 134/82  Pulse: 76  Weight: 208 lb (94.3 kg)  Body mass index is 33.57 kg/m.        Physical Examination:   General appearance: Well appearing, and in no distress  Mental  status: Alert, oriented to person, place, and time  Skin: Warm & dry  Cardiovascular: Normal heart rate noted  Respiratory: Normal respiratory effort, no distress  Abdomen: Soft, gravid, nontender  Pelvic: Cervical exam performed  Dilation: Closed Effacement (%): Thick Station: Ballotable  Extremities: Edema: None  Fetal Status: Fetal Heart Rate (bpm): 137 Fundal Height: 34 cm Movement: Present Presentation: Vertex  Chaperone: Peggy Dones No results found for this or any previous visit (from the past 24 hours).  Assessment & Plan:  1) Low-risk pregnancy G1P0000 at [redacted]w[redacted]d with an Estimated Date of Delivery: 02/29/24    Meds: No orders of the defined types were placed in this encounter.  Labs/procedures today: GBS, GC/CT, and SVE  Plan:  Continue routine obstetrical care  Next visit: prefers in person    Reviewed: Preterm labor symptoms and general obstetric precautions including but not limited to vaginal bleeding, contractions, leaking of fluid and fetal movement were reviewed in detail with the patient.  All questions were answered. Does have home bp cuff. Office bp cuff given: not applicable. Check bp daily, let us  know if consistently >140 and/or >90, reviewed pre-e s/s, reasons to seek care.  Follow-up: Return for As scheduled.  Future Appointments  Date Time Provider  Department Center  02/09/2024  1:50 PM Ferd Householder, CNM [facility]  02/16/2024  1:50 PM Ferd Householder, CNM [facility]  02/23/2024  1:50 PM Majel Scott, CNM [facility]  03/01/2024  1:50 PM [facility] NURSE [facility]  03/01/2024  2:10 PM Ferd Householder, CNM [facility]    Orders Placed This Encounter  Procedures   Culture, beta strep (group b only)   Ferd Householder CNM, WHNP-BC 02/02/2024 2:30 PM

## 2024-02-04 LAB — CERVICOVAGINAL ANCILLARY ONLY
Chlamydia: NEGATIVE
Comment: NEGATIVE
Comment: NORMAL
Neisseria Gonorrhea: NEGATIVE

## 2024-02-05 LAB — CULTURE, BETA STREP (GROUP B ONLY): Strep Gp B Culture: POSITIVE — AB

## 2024-02-09 ENCOUNTER — Encounter: Payer: Self-pay | Admitting: Women's Health

## 2024-02-09 ENCOUNTER — Ambulatory Visit: Admitting: Women's Health

## 2024-02-09 VITALS — BP 134/78 | HR 83 | Wt 214.0 lb

## 2024-02-09 DIAGNOSIS — Z3403 Encounter for supervision of normal first pregnancy, third trimester: Secondary | ICD-10-CM

## 2024-02-09 DIAGNOSIS — Z3A37 37 weeks gestation of pregnancy: Secondary | ICD-10-CM | POA: Diagnosis not present

## 2024-02-09 NOTE — Progress Notes (Signed)
 LOW-RISK PREGNANCY VISIT Patient name: Breanna Soto MRN [id number]  Date of birth: 2006-06-28 Chief Complaint:   Routine Prenatal Visit  History of Present Illness:   Breanna Soto is a 18 y.o. G15P0000 female at [redacted]w[redacted]d with an Estimated Date of Delivery: 02/29/24 being seen today for ongoing management of a low-risk pregnancy.   Today she reports no complaints. Contractions: Not present. Vag. Bleeding: None.  Movement: Present. denies leaking of fluid.     12/08/2023    9:09 AM 08/21/2023   10:41 AM 05/07/2023    9:33 AM  Depression screen PHQ 2/9  Decreased Interest 0 1 0  Down, Depressed, Hopeless 0 0 0  PHQ - 2 Score 0 1 0  Altered sleeping 1 2 3   Tired, decreased energy 1 2 3   Change in appetite 0 2 2  Feeling bad or failure about yourself  0 0 0  Trouble concentrating 0 0 0  Moving slowly or fidgety/restless 0 0 0  Suicidal thoughts 0 0 0  PHQ-9 Score 2 7 8         12/08/2023    9:11 AM 08/21/2023   10:42 AM 05/07/2023    9:34 AM  GAD 7 : Generalized Anxiety Score  Nervous, Anxious, on Edge 0 2 3  Control/stop worrying 0 2 3  Worry too much - different things 1 2 3   Trouble relaxing 1 1 3   Restless 0 0 1  Easily annoyed or irritable 1 1 0  Afraid - awful might happen 0 3 0  Total GAD 7 Score 3 11 13       Review of Systems:   Pertinent items are noted in HPI Denies abnormal vaginal discharge w/ itching/odor/irritation, headaches, visual changes, shortness of breath, chest pain, abdominal pain, severe nausea/vomiting, or problems with urination or bowel movements unless otherwise stated above. Pertinent History Reviewed:  Reviewed past medical,surgical, social, obstetrical and family history.  Reviewed problem list, medications and allergies. Physical Assessment:   Vitals:   02/09/24 1400  BP: 134/78  Pulse: 83  Weight: 214 lb (97.1 kg)  Body mass index is 34.54 kg/m.        Physical Examination:   General appearance: Well appearing, and in no  distress  Mental status: Alert, oriented to person, place, and time  Skin: Warm & dry  Cardiovascular: Normal heart rate noted  Respiratory: Normal respiratory effort, no distress  Abdomen: Soft, gravid, nontender  Pelvic: Cervical exam deferred         Extremities: Edema: None  Fetal Status: Fetal Heart Rate (bpm): 138 Fundal Height: 35 cm Movement: Present    Chaperone: N/A No results found for this or any previous visit (from the past 24 hours).  Assessment & Plan:  1) Low-risk pregnancy G1P0000 at [redacted]w[redacted]d with an Estimated Date of Delivery: 02/29/24     Meds: No orders of the defined types were placed in this encounter.  Labs/procedures today: none  Plan:  Continue routine obstetrical care  Next visit: prefers in person    Reviewed: Term labor symptoms and general obstetric precautions including but not limited to vaginal bleeding, contractions, leaking of fluid and fetal movement were reviewed in detail with the patient.  All questions were answered. Does have home bp cuff. Office bp cuff given: not applicable. Check bp weekly, let us  know if consistently >140 and/or >90.  Follow-up: Return for As scheduled.  Future Appointments  Date Time Provider Department Center  02/16/2024  1:50 PM Kizzie Suzen SAUNDERS, CNM  [facility]  02/23/2024  1:50 PM Newton Mering, CNM [facility]  03/01/2024  1:50 PM [facility] NURSE [facility]  03/01/2024  2:10 PM Kizzie Suzen SAUNDERS, CNM [facility]    No orders of the defined types were placed in this encounter.  Suzen SAUNDERS Kizzie CNM, [facility] 02/09/2024 2:19 PM

## 2024-02-09 NOTE — Patient Instructions (Signed)
 Breanna Soto, thank you for choosing our office today! We appreciate the opportunity to meet your healthcare needs. You may receive a short survey by mail, e-mail, or through [employer]. If you are happy with your care we would appreciate if you could take just a few minutes to complete the survey questions. We read all of your comments and take your feedback very seriously. Thank you again for choosing our office.  Center for [employer] Team at [facility]  [facility] at [facility] ([address] [place], [place] [address]) Entrance C, located off of [address] Free 24/7 valet parking   CLASSES: Go to [email or web address] to register for classes (childbirth, breastfeeding, waterbirth, infant CPR, daddy bootcamp, etc.)  Call the office [phone]) or go to [facility] if: You begin to have strong, frequent contractions Your water breaks.  Sometimes it is a big gush of fluid, sometimes it is just a trickle that keeps getting your panties wet or running down your legs You have vaginal bleeding.  It is normal to have a small amount of spotting if your cervix was checked.  You don't feel your baby moving like normal.  If you don't, get you something to eat and drink and lay down and focus on feeling your baby move.   If your baby is still not moving like normal, you should call the office or go to [facility].  Call the office [phone]) or go to [facility] hospital for these signs of pre-eclampsia: Severe headache that does not go away with Tylenol  Visual changes- seeing spots, double, blurred vision Pain under your right breast or upper abdomen that does not go away with Tums or heartburn medicine Nausea and/or vomiting Severe swelling in your hands, feet, and face   [place] Pediatricians/Family Doctors [facility]): [address] Luba BROCKS, [phone]           [facility]: [address], [phone]                 [facility]): [address], [phone] (call to ask if accepting patients) [facility] Department: [address] [place], [phone]    [facility] Pediatricians/Family Doctors [facility]): [address], [phone] [facility]: [address] [place], [phone] [facility]: [address], [phone]  [facility] Doctors  [facility]): [phone] [facility]: [address], [phone]   [facility] Doctors [facility]: [address] [phone]  [facility] Doctors  [facility]: [phone], [phone]  Home Blood Pressure Monitoring for Patients   Your provider has recommended that you check your blood pressure (BP) at least once a week at home. If you do not have a blood pressure cuff at home, one will be provided for you. Contact your provider if you have not received your monitor within 1 week.   Helpful Tips for Accurate Home Blood Pressure Checks  Don't smoke, exercise, or drink caffeine 30 minutes before checking your BP Use the restroom before checking your BP (a full bladder can raise your pressure) Relax in a comfortable upright chair Feet on the ground Left arm resting comfortably on a flat surface at the level of your heart Legs uncrossed Back supported Sit quietly and don't talk Place the cuff on your bare arm Adjust snuggly, so that only two fingertips  can fit between your skin and the top of the cuff Check 2 readings separated by at least one minute Keep a log of your BP readings For a visual, please reference this diagram: http://ccnc.care/bpdiagram  Provider Name: [facility]     Phone: [phone]  Zone 1: ALL CLEAR  Continue to monitor your symptoms:  BP reading is less than 140 (top number) or less than 90 (bottom number)  No right  upper stomach pain No headaches or seeing spots No feeling nauseated or throwing up No swelling in face and hands  Zone 2: CAUTION Call your doctor's office for any of the following:  BP reading is greater than 140 (top number) or greater than 90 (bottom number)  Stomach pain under your ribs in the middle or right side Headaches or seeing spots Feeling nauseated or throwing up Swelling in face and hands  Zone 3: EMERGENCY  Seek immediate medical care if you have any of the following:  BP reading is greater than160 (top number) or greater than 110 (bottom number) Severe headaches not improving with Tylenol  Serious difficulty catching your breath Any worsening symptoms from Zone 2   Braxton Hicks Contractions Contractions of the uterus can occur throughout pregnancy, but they are not always a sign that you are in labor. You may have practice contractions called Braxton Hicks contractions. These false labor contractions are sometimes confused with true labor. What are Darol Irving contractions? Braxton Hicks contractions are tightening movements that occur in the muscles of the uterus before labor. Unlike true labor contractions, these contractions do not result in opening (dilation) and thinning of the cervix. Toward the end of pregnancy (32-34 weeks), Braxton Hicks contractions can happen more often and may become stronger. These contractions are sometimes difficult to tell apart from true labor because they can be very uncomfortable. You should not feel embarrassed if you go to the hospital with false labor. Sometimes, the only way to tell if you are in true labor is for your health care provider to look for changes in the cervix. The health care provider will do a physical exam and may monitor your contractions. If you are not in true labor, the exam should show that your cervix is not dilating and your water has not broken. If there are no other health problems associated with your  pregnancy, it is completely safe for you to be sent home with false labor. You may continue to have Braxton Hicks contractions until you go into true labor. How to tell the difference between true labor and false labor True labor Contractions last 30-70 seconds. Contractions become very regular. Discomfort is usually felt in the top of the uterus, and it spreads to the lower abdomen and low back. Contractions do not go away with walking. Contractions usually become more intense and increase in frequency. The cervix dilates and gets thinner. False labor Contractions are usually shorter and not as strong as true labor contractions. Contractions are usually irregular. Contractions are often felt in the front of the lower abdomen and in the groin. Contractions may go away when you walk around or change positions while lying down. Contractions get weaker and are shorter-lasting as time goes on. The cervix usually does not dilate or become thin. Follow these instructions at home:  Take over-the-counter and prescription medicines only as told by your health care provider. Keep up with your usual exercises and follow other instructions from your health care provider. Eat and drink lightly if you think  you are going into labor. If Braxton Hicks contractions are making you uncomfortable: Change your position from lying down or resting to walking, or change from walking to resting. Sit and rest in a tub of warm water. Drink enough fluid to keep your urine pale yellow. Dehydration may cause these contractions. Do slow and deep breathing several times an hour. Keep all follow-up prenatal visits as told by your health care provider. This is important. Contact a health care provider if: You have a fever. You have continuous pain in your abdomen. Get help right away if: Your contractions become stronger, more regular, and closer together. You have fluid leaking or gushing from your vagina. You pass  blood-tinged mucus (bloody show). You have bleeding from your vagina. You have low back pain that you never had before. You feel your baby's head pushing down and causing pelvic pressure. Your baby is not moving inside you as much as it used to. Summary Contractions that occur before labor are called Braxton Hicks contractions, false labor, or practice contractions. Braxton Hicks contractions are usually shorter, weaker, farther apart, and less regular than true labor contractions. True labor contractions usually become progressively stronger and regular, and they become more frequent. Manage discomfort from [facility] contractions by changing position, resting in a warm bath, drinking plenty of water, or practicing deep breathing. This information is not intended to replace advice given to you by your health care provider. Make sure you discuss any questions you have with your health care provider. Document Revised: 07/18/2017 Document Reviewed: 12/19/2016 Elsevier Patient Education  2020 [employer].

## 2024-02-16 ENCOUNTER — Encounter: Payer: Self-pay | Admitting: Women's Health

## 2024-02-16 ENCOUNTER — Ambulatory Visit: Admitting: Women's Health

## 2024-02-16 VITALS — BP 134/82 | HR 71 | Wt 220.0 lb

## 2024-02-16 DIAGNOSIS — Z3A38 38 weeks gestation of pregnancy: Secondary | ICD-10-CM

## 2024-02-16 DIAGNOSIS — Z3403 Encounter for supervision of normal first pregnancy, third trimester: Secondary | ICD-10-CM | POA: Diagnosis not present

## 2024-02-16 NOTE — Progress Notes (Signed)
 LOW-RISK PREGNANCY VISIT Patient name: Breanna Soto MRN [id number]  Date of birth: 2006-01-12 Chief Complaint:   Routine Prenatal Visit (Cervical check)  History of Present Illness:   Breanna Soto is a 18 y.o. G39P0000 female at [redacted]w[redacted]d with an Estimated Date of Delivery: 02/29/24 being seen today for ongoing management of a low-risk pregnancy.   Today she reports no complaints. Contractions: Irregular.  .  Movement: Present. denies leaking of fluid.     12/08/2023    9:09 AM 08/21/2023   10:41 AM 05/07/2023    9:33 AM  Depression screen PHQ 2/9  Decreased Interest 0 1 0  Down, Depressed, Hopeless 0 0 0  PHQ - 2 Score 0 1 0  Altered sleeping 1 2 3   Tired, decreased energy 1 2 3   Change in appetite 0 2 2  Feeling bad or failure about yourself  0 0 0  Trouble concentrating 0 0 0  Moving slowly or fidgety/restless 0 0 0  Suicidal thoughts 0 0 0  PHQ-9 Score 2 7 8         12/08/2023    9:11 AM 08/21/2023   10:42 AM 05/07/2023    9:34 AM  GAD 7 : Generalized Anxiety Score  Nervous, Anxious, on Edge 0 2 3  Control/stop worrying 0 2 3  Worry too much - different things 1 2 3   Trouble relaxing 1 1 3   Restless 0 0 1  Easily annoyed or irritable 1 1 0  Afraid - awful might happen 0 3 0  Total GAD 7 Score 3 11 13       Review of Systems:   Pertinent items are noted in HPI Denies abnormal vaginal discharge w/ itching/odor/irritation, headaches, visual changes, shortness of breath, chest pain, abdominal pain, severe nausea/vomiting, or problems with urination or bowel movements unless otherwise stated above. Pertinent History Reviewed:  Reviewed past medical,surgical, social, obstetrical and family history.  Reviewed problem list, medications and allergies. Physical Assessment:   Vitals:   02/16/24 1421  BP: 134/82  Pulse: 71  Weight: 220 lb (99.8 kg)  Body mass index is 35.51 kg/m.        Physical Examination:   General appearance: Well appearing, and in no distress  Mental  status: Alert, oriented to person, place, and time  Skin: Warm & dry  Cardiovascular: Normal heart rate noted  Respiratory: Normal respiratory effort, no distress  Abdomen: Soft, gravid, nontender  Pelvic: Cervical exam performed  Dilation: 1 Effacement (%): 80 Station: -2  Extremities: Edema: None  Fetal Status: Fetal Heart Rate (bpm): 142 Fundal Height: 36 cm Movement: Present Presentation: Vertex  Chaperone: Peggy Dones No results found for this or any previous visit (from the past 24 hours).  Assessment & Plan:  1) Low-risk pregnancy G1P0000 at [redacted]w[redacted]d with an Estimated Date of Delivery: 02/29/24    Meds: No orders of the defined types were placed in this encounter.  Labs/procedures today: SVE  Plan:  Continue routine obstetrical care  Next visit: prefers in person    Reviewed: Term labor symptoms and general obstetric precautions including but not limited to vaginal bleeding, contractions, leaking of fluid and fetal movement were reviewed in detail with the patient.  All questions were answered. Does have home bp cuff. Office bp cuff given: not applicable. Check bp daily, let us  know if consistently >140 and/or >90 or pre-e s/s.  Follow-up: Return for As scheduled.  Future Appointments  Date Time Provider Department Center  02/23/2024  1:50 PM  Newton Mering, CNM [facility]  03/01/2024  1:50 PM [facility] NURSE [facility]  03/01/2024  2:10 PM Kizzie Suzen SAUNDERS, CNM [facility]    No orders of the defined types were placed in this encounter.  Suzen SAUNDERS Kizzie CNM, [facility] 02/16/2024 2:36 PM

## 2024-02-16 NOTE — Patient Instructions (Signed)
 Breanna Soto, thank you for choosing our office today! We appreciate the opportunity to meet your healthcare needs. You may receive a short survey by mail, e-mail, or through [employer]. If you are happy with your care we would appreciate if you could take just a few minutes to complete the survey questions. We read all of your comments and take your feedback very seriously. Thank you again for choosing our office.  Center for [employer] Team at [facility]  [facility] at [facility] ([address] [place], [place] [address]) Entrance C, located off of [address] Free 24/7 valet parking   CLASSES: Go to [email or web address] to register for classes (childbirth, breastfeeding, waterbirth, infant CPR, daddy bootcamp, etc.)  Call the office [phone]) or go to [facility] if: You begin to have strong, frequent contractions Your water breaks.  Sometimes it is a big gush of fluid, sometimes it is just a trickle that keeps getting your panties wet or running down your legs You have vaginal bleeding.  It is normal to have a small amount of spotting if your cervix was checked.  You don't feel your baby moving like normal.  If you don't, get you something to eat and drink and lay down and focus on feeling your baby move.   If your baby is still not moving like normal, you should call the office or go to [facility].  Call the office [phone]) or go to [facility] hospital for these signs of pre-eclampsia: Severe headache that does not go away with Tylenol  Visual changes- seeing spots, double, blurred vision Pain under your right breast or upper abdomen that does not go away with Tums or heartburn medicine Nausea and/or vomiting Severe swelling in your hands, feet, and face   [place] Pediatricians/Family Doctors [facility]): [address] Luba BROCKS, [phone]           [facility]: [address], [phone]                 [facility]): [address], [phone] (call to ask if accepting patients) [facility] Department: [address] [place], [phone]    [facility] Pediatricians/Family Doctors [facility]): [address], [phone] [facility]: [address] [place], [phone] [facility]: [address], [phone]  [facility] Doctors  [facility]): [phone] [facility]: [address], [phone]   [facility] Doctors [facility]: [address] [phone]  [facility] Doctors  [facility]: [phone], [phone]  Home Blood Pressure Monitoring for Patients   Your provider has recommended that you check your blood pressure (BP) at least once a week at home. If you do not have a blood pressure cuff at home, one will be provided for you. Contact your provider if you have not received your monitor within 1 week.   Helpful Tips for Accurate Home Blood Pressure Checks  Don't smoke, exercise, or drink caffeine 30 minutes before checking your BP Use the restroom before checking your BP (a full bladder can raise your pressure) Relax in a comfortable upright chair Feet on the ground Left arm resting comfortably on a flat surface at the level of your heart Legs uncrossed Back supported Sit quietly and don't talk Place the cuff on your bare arm Adjust snuggly, so that only two fingertips  can fit between your skin and the top of the cuff Check 2 readings separated by at least one minute Keep a log of your BP readings For a visual, please reference this diagram: http://ccnc.care/bpdiagram  Provider Name: [facility]     Phone: [phone]  Zone 1: ALL CLEAR  Continue to monitor your symptoms:  BP reading is less than 140 (top number) or less than 90 (bottom number)  No right  upper stomach pain No headaches or seeing spots No feeling nauseated or throwing up No swelling in face and hands  Zone 2: CAUTION Call your doctor's office for any of the following:  BP reading is greater than 140 (top number) or greater than 90 (bottom number)  Stomach pain under your ribs in the middle or right side Headaches or seeing spots Feeling nauseated or throwing up Swelling in face and hands  Zone 3: EMERGENCY  Seek immediate medical care if you have any of the following:  BP reading is greater than160 (top number) or greater than 110 (bottom number) Severe headaches not improving with Tylenol  Serious difficulty catching your breath Any worsening symptoms from Zone 2   Braxton Hicks Contractions Contractions of the uterus can occur throughout pregnancy, but they are not always a sign that you are in labor. You may have practice contractions called Braxton Hicks contractions. These false labor contractions are sometimes confused with true labor. What are Darol Irving contractions? Braxton Hicks contractions are tightening movements that occur in the muscles of the uterus before labor. Unlike true labor contractions, these contractions do not result in opening (dilation) and thinning of the cervix. Toward the end of pregnancy (32-34 weeks), Braxton Hicks contractions can happen more often and may become stronger. These contractions are sometimes difficult to tell apart from true labor because they can be very uncomfortable. You should not feel embarrassed if you go to the hospital with false labor. Sometimes, the only way to tell if you are in true labor is for your health care provider to look for changes in the cervix. The health care provider will do a physical exam and may monitor your contractions. If you are not in true labor, the exam should show that your cervix is not dilating and your water has not broken. If there are no other health problems associated with your  pregnancy, it is completely safe for you to be sent home with false labor. You may continue to have Braxton Hicks contractions until you go into true labor. How to tell the difference between true labor and false labor True labor Contractions last 30-70 seconds. Contractions become very regular. Discomfort is usually felt in the top of the uterus, and it spreads to the lower abdomen and low back. Contractions do not go away with walking. Contractions usually become more intense and increase in frequency. The cervix dilates and gets thinner. False labor Contractions are usually shorter and not as strong as true labor contractions. Contractions are usually irregular. Contractions are often felt in the front of the lower abdomen and in the groin. Contractions may go away when you walk around or change positions while lying down. Contractions get weaker and are shorter-lasting as time goes on. The cervix usually does not dilate or become thin. Follow these instructions at home:  Take over-the-counter and prescription medicines only as told by your health care provider. Keep up with your usual exercises and follow other instructions from your health care provider. Eat and drink lightly if you think  you are going into labor. If Braxton Hicks contractions are making you uncomfortable: Change your position from lying down or resting to walking, or change from walking to resting. Sit and rest in a tub of warm water. Drink enough fluid to keep your urine pale yellow. Dehydration may cause these contractions. Do slow and deep breathing several times an hour. Keep all follow-up prenatal visits as told by your health care provider. This is important. Contact a health care provider if: You have a fever. You have continuous pain in your abdomen. Get help right away if: Your contractions become stronger, more regular, and closer together. You have fluid leaking or gushing from your vagina. You pass  blood-tinged mucus (bloody show). You have bleeding from your vagina. You have low back pain that you never had before. You feel your baby's head pushing down and causing pelvic pressure. Your baby is not moving inside you as much as it used to. Summary Contractions that occur before labor are called Braxton Hicks contractions, false labor, or practice contractions. Braxton Hicks contractions are usually shorter, weaker, farther apart, and less regular than true labor contractions. True labor contractions usually become progressively stronger and regular, and they become more frequent. Manage discomfort from [facility] contractions by changing position, resting in a warm bath, drinking plenty of water, or practicing deep breathing. This information is not intended to replace advice given to you by your health care provider. Make sure you discuss any questions you have with your health care provider. Document Revised: 07/18/2017 Document Reviewed: 12/19/2016 Elsevier Patient Education  2020 [employer].

## 2024-02-17 ENCOUNTER — Inpatient Hospital Stay (HOSPITAL_COMMUNITY)
Admission: AD | Admit: 2024-02-17 | Discharge: 2024-02-20 | DRG: 788 | Disposition: A | Discharge status: Home / Self Care | Attending: Family Medicine | Admitting: Family Medicine

## 2024-02-17 DIAGNOSIS — O4292 Full-term premature rupture of membranes, unspecified as to length of time between rupture and onset of labor: Principal | ICD-10-CM | POA: Diagnosis present

## 2024-02-17 DIAGNOSIS — O99824 Streptococcus B carrier state complicating childbirth: Secondary | ICD-10-CM | POA: Diagnosis present

## 2024-02-17 DIAGNOSIS — O285 Abnormal chromosomal and genetic finding on antenatal screening of mother: Secondary | ICD-10-CM | POA: Diagnosis present

## 2024-02-17 DIAGNOSIS — Z98891 History of uterine scar from previous surgery: Secondary | ICD-10-CM

## 2024-02-17 DIAGNOSIS — Z3A38 38 weeks gestation of pregnancy: Secondary | ICD-10-CM

## 2024-02-17 DIAGNOSIS — Z833 Family history of diabetes mellitus: Secondary | ICD-10-CM

## 2024-02-17 DIAGNOSIS — O134 Gestational [pregnancy-induced] hypertension without significant proteinuria, complicating childbirth: Secondary | ICD-10-CM | POA: Diagnosis present

## 2024-02-17 DIAGNOSIS — Z148 Genetic carrier of other disease: Secondary | ICD-10-CM

## 2024-02-17 DIAGNOSIS — Z3403 Encounter for supervision of normal first pregnancy, third trimester: Secondary | ICD-10-CM

## 2024-02-17 DIAGNOSIS — Z7722 Contact with and (suspected) exposure to environmental tobacco smoke (acute) (chronic): Secondary | ICD-10-CM | POA: Diagnosis present

## 2024-02-17 DIAGNOSIS — D563 Thalassemia minor: Secondary | ICD-10-CM | POA: Diagnosis present

## 2024-02-17 DIAGNOSIS — Z3400 Encounter for supervision of normal first pregnancy, unspecified trimester: Secondary | ICD-10-CM

## 2024-02-17 DIAGNOSIS — O133 Gestational [pregnancy-induced] hypertension without significant proteinuria, third trimester: Secondary | ICD-10-CM

## 2024-02-17 DIAGNOSIS — O4290 Premature rupture of membranes, unspecified as to length of time between rupture and onset of labor, unspecified weeks of gestation: Principal | ICD-10-CM | POA: Diagnosis present

## 2024-02-17 NOTE — MAU Note (Signed)
 Breanna Soto is a 18 y.o. at [redacted]w[redacted]d here in MAU reporting leaking clear fld about 2145 one time. Fld was clear and odorless. She has not leaked anymore. Has lost mucous plug. Reports good FM and no VB. Mild cramping  LMP: n/a Onset of complaint: 2145 Pain score: 6 Vitals:   02/17/24 2341 02/17/24 2344  BP:  (!) 144/92  Pulse: 79   Resp: 17   Temp: 98.6 F (37 C)   SpO2: 100%      FHT: 150  Lab orders placed from triage: labor eval

## 2024-02-18 ENCOUNTER — Other Ambulatory Visit: Payer: Self-pay

## 2024-02-18 ENCOUNTER — Encounter (HOSPITAL_COMMUNITY): Payer: Self-pay | Admitting: Obstetrics and Gynecology

## 2024-02-18 ENCOUNTER — Encounter (HOSPITAL_COMMUNITY)
Admission: AD | Disposition: A | Discharge status: Home / Self Care | Payer: Self-pay | Source: Home / Self Care | Attending: Family Medicine

## 2024-02-18 ENCOUNTER — Inpatient Hospital Stay (HOSPITAL_COMMUNITY): Admitting: Anesthesiology

## 2024-02-18 DIAGNOSIS — O4202 Full-term premature rupture of membranes, onset of labor within 24 hours of rupture: Secondary | ICD-10-CM | POA: Diagnosis not present

## 2024-02-18 DIAGNOSIS — D563 Thalassemia minor: Secondary | ICD-10-CM | POA: Diagnosis present

## 2024-02-18 DIAGNOSIS — O99820 Streptococcus B carrier state complicating pregnancy: Secondary | ICD-10-CM | POA: Diagnosis not present

## 2024-02-18 DIAGNOSIS — Z833 Family history of diabetes mellitus: Secondary | ICD-10-CM | POA: Diagnosis not present

## 2024-02-18 DIAGNOSIS — Z3A38 38 weeks gestation of pregnancy: Secondary | ICD-10-CM

## 2024-02-18 DIAGNOSIS — O26893 Other specified pregnancy related conditions, third trimester: Secondary | ICD-10-CM | POA: Diagnosis not present

## 2024-02-18 DIAGNOSIS — O4292 Full-term premature rupture of membranes, unspecified as to length of time between rupture and onset of labor: Secondary | ICD-10-CM | POA: Diagnosis not present

## 2024-02-18 DIAGNOSIS — O99824 Streptococcus B carrier state complicating childbirth: Secondary | ICD-10-CM | POA: Diagnosis not present

## 2024-02-18 DIAGNOSIS — O134 Gestational [pregnancy-induced] hypertension without significant proteinuria, complicating childbirth: Secondary | ICD-10-CM | POA: Diagnosis not present

## 2024-02-18 DIAGNOSIS — O4290 Premature rupture of membranes, unspecified as to length of time between rupture and onset of labor, unspecified weeks of gestation: Secondary | ICD-10-CM | POA: Diagnosis present

## 2024-02-18 DIAGNOSIS — Z148 Genetic carrier of other disease: Secondary | ICD-10-CM | POA: Diagnosis not present

## 2024-02-18 DIAGNOSIS — Z7722 Contact with and (suspected) exposure to environmental tobacco smoke (acute) (chronic): Secondary | ICD-10-CM | POA: Diagnosis not present

## 2024-02-18 LAB — TYPE AND SCREEN
ABO/RH(D): B POS
Antibody Screen: NEGATIVE

## 2024-02-18 LAB — CBC
HCT: 35.6 % — ABNORMAL LOW (ref 36.0–46.0)
HCT: 39.2 % (ref 36.0–46.0)
Hemoglobin: 12.4 g/dL (ref 12.0–15.0)
Hemoglobin: 13.3 g/dL (ref 12.0–15.0)
MCH: 27.7 pg (ref 26.0–34.0)
MCH: 26.7 pg (ref 26.0–34.0)
MCHC: 34.8 g/dL (ref 30.0–36.0)
MCHC: 33.9 g/dL (ref 30.0–36.0)
MCV: 79.5 fL — ABNORMAL LOW (ref 80.0–100.0)
MCV: 78.6 fL — ABNORMAL LOW (ref 80.0–100.0)
Platelets: 173 10*3/uL (ref 150–400)
Platelets: 178 10*3/uL (ref 150–400)
RBC: 4.48 MIL/uL (ref 3.87–5.11)
RBC: 4.99 MIL/uL (ref 3.87–5.11)
RDW: 13.9 % (ref 11.5–15.5)
RDW: 13.7 % (ref 11.5–15.5)
WBC: 9.7 10*3/uL (ref 4.0–10.5)
WBC: 13.1 10*3/uL — ABNORMAL HIGH (ref 4.0–10.5)
nRBC: 0 % (ref 0.0–0.2)
nRBC: 0 % (ref 0.0–0.2)

## 2024-02-18 LAB — COMPREHENSIVE METABOLIC PANEL WITH GFR
ALT: 18 U/L (ref 0–44)
AST: 25 U/L (ref 15–41)
Albumin: 2.7 g/dL — ABNORMAL LOW (ref 3.5–5.0)
Alkaline Phosphatase: 117 U/L (ref 38–126)
Anion gap: 7 (ref 5–15)
BUN: 7 mg/dL (ref 6–20)
CO2: 18 mmol/L — ABNORMAL LOW (ref 22–32)
Calcium: 8.8 mg/dL — ABNORMAL LOW (ref 8.9–10.3)
Chloride: 112 mmol/L — ABNORMAL HIGH (ref 98–111)
Creatinine, Ser: 0.76 mg/dL (ref 0.44–1.00)
GFR, Estimated: >60 mL/min (ref >60)
Glucose, Bld: 86 mg/dL (ref 70–99)
Potassium: 3.8 mmol/L (ref 3.5–5.1)
Sodium: 137 mmol/L (ref 135–145)
Total Bilirubin: 0.5 mg/dL (ref 0.0–1.2)
Total Protein: 6.3 g/dL — ABNORMAL LOW (ref 6.5–8.1)

## 2024-02-18 LAB — PROTEIN / CREATININE RATIO, URINE
Creatinine, Urine: 152 mg/dL
Protein Creatinine Ratio: 0.09 mg/mg{creat} (ref 0.00–0.15)
Total Protein, Urine: 13 mg/dL

## 2024-02-18 LAB — POCT FERN TEST: POCT Fern Test: POSITIVE

## 2024-02-18 LAB — RPR: RPR Ser Ql: NONREACTIVE

## 2024-02-18 SURGERY — Surgical Case
Anesthesia: Epidural

## 2024-02-18 MED ORDER — OXYTOCIN-SODIUM CHLORIDE 30-0.9 UT/500ML-% IV SOLN
2.5000 [IU]/h | INTRAVENOUS | Status: DC
  Filled 2024-02-18: qty 500

## 2024-02-18 MED ORDER — LACTATED RINGERS IV SOLN
500.0000 mL | INTRAVENOUS | Status: DC | PRN
  Administered 2024-02-18 (×2): 500 mL via INTRAVENOUS

## 2024-02-18 MED ORDER — CEFAZOLIN SODIUM-DEXTROSE 2-3 GM-%(50ML) IV SOLR
INTRAVENOUS | Status: DC | PRN
  Administered 2024-02-18: 2 g via INTRAVENOUS

## 2024-02-18 MED ORDER — FENTANYL CITRATE (PF) 100 MCG/2ML IJ SOLN
INTRAMUSCULAR | Status: AC
  Filled 2024-02-18: qty 2

## 2024-02-18 MED ORDER — LIDOCAINE HCL (PF) 1 % IJ SOLN
INTRAMUSCULAR | Status: DC | PRN
Start: 2024-02-18 — End: 2024-02-19
  Administered 2024-02-18 (×2): 4 mL via EPIDURAL

## 2024-02-18 MED ORDER — LIDOCAINE HCL (PF) 1 % IJ SOLN: 30.0000 mL | INTRAMUSCULAR | Status: DC | PRN

## 2024-02-18 MED ORDER — ONDANSETRON HCL 4 MG/2ML IJ SOLN
4.0000 mg | Freq: Four times a day (QID) | INTRAMUSCULAR | Status: DC | PRN
  Administered 2024-02-18: 4 mg via INTRAVENOUS
  Filled 2024-02-18: qty 2

## 2024-02-18 MED ORDER — DEXMEDETOMIDINE HCL IN NACL 80 MCG/20ML IV SOLN
INTRAVENOUS | Status: DC | PRN
  Administered 2024-02-18: 8 ug via INTRAVENOUS

## 2024-02-18 MED ORDER — PHENYLEPHRINE 80 MCG/ML (10ML) SYRINGE FOR IV PUSH (FOR BLOOD PRESSURE SUPPORT)
80.0000 ug | PREFILLED_SYRINGE | INTRAVENOUS | Status: DC | PRN
Start: 2024-02-18 — End: 2024-02-19

## 2024-02-18 MED ORDER — LACTATED RINGERS IV SOLN: INTRAVENOUS | Status: DC

## 2024-02-18 MED ORDER — SOD CITRATE-CITRIC ACID 500-334 MG/5ML PO SOLN
30.0000 mL | ORAL | Status: AC
  Administered 2024-02-18: 30 mL via ORAL

## 2024-02-18 MED ORDER — PENICILLIN G POT IN DEXTROSE 60000 UNIT/ML IV SOLN
3.0000 10*6.[IU] | INTRAVENOUS | Status: DC
  Administered 2024-02-18 (×3): 3 10*6.[IU] via INTRAVENOUS
  Filled 2024-02-18 (×5): qty 50

## 2024-02-18 MED ORDER — MORPHINE SULFATE (PF) 0.5 MG/ML IJ SOLN
INTRAMUSCULAR | Status: DC | PRN
  Administered 2024-02-18: 3 mg via EPIDURAL

## 2024-02-18 MED ORDER — PHENYLEPHRINE 80 MCG/ML (10ML) SYRINGE FOR IV PUSH (FOR BLOOD PRESSURE SUPPORT): 80.0000 ug | PREFILLED_SYRINGE | INTRAVENOUS | Status: DC | PRN

## 2024-02-18 MED ORDER — LACTATED RINGERS AMNIOINFUSION: INTRAVENOUS | Status: DC

## 2024-02-18 MED ORDER — LACTATED RINGERS IV SOLN
500.0000 mL | Freq: Once | INTRAVENOUS | Status: AC
  Administered 2024-02-18: 500 mL via INTRAVENOUS

## 2024-02-18 MED ORDER — FENTANYL-BUPIVACAINE-NACL 0.5-0.125-0.9 MG/250ML-% EP SOLN
12.0000 mL/h | EPIDURAL | Status: DC | PRN
  Administered 2024-02-18: 12 mL/h via EPIDURAL
  Filled 2024-02-18: qty 250

## 2024-02-18 MED ORDER — SOD CITRATE-CITRIC ACID 500-334 MG/5ML PO SOLN
30.0000 mL | ORAL | Status: DC | PRN
  Filled 2024-02-18: qty 30

## 2024-02-18 MED ORDER — DIPHENHYDRAMINE HCL 50 MG/ML IJ SOLN: 12.5000 mg | INTRAMUSCULAR | Status: DC | PRN

## 2024-02-18 MED ORDER — SODIUM CHLORIDE 0.9 % IV SOLN
5.0000 10*6.[IU] | Freq: Once | INTRAVENOUS | Status: AC
  Administered 2024-02-18: 5 10*6.[IU] via INTRAVENOUS
  Filled 2024-02-18: qty 5

## 2024-02-18 MED ORDER — EPHEDRINE 5 MG/ML INJ: 10.0000 mg | INTRAVENOUS | Status: DC | PRN

## 2024-02-18 MED ORDER — CEFAZOLIN SODIUM-DEXTROSE 2-4 GM/100ML-% IV SOLN: 2.0000 g | INTRAVENOUS | Status: DC

## 2024-02-18 MED ORDER — TERBUTALINE SULFATE 1 MG/ML IJ SOLN
0.2500 mg | Freq: Once | INTRAMUSCULAR | Status: AC | PRN
  Administered 2024-02-18: 0.25 mg via SUBCUTANEOUS
  Filled 2024-02-18: qty 1

## 2024-02-18 MED ORDER — STERILE WATER FOR IRRIGATION IR SOLN
Status: DC | PRN
  Administered 2024-02-18: 1000 mL

## 2024-02-18 MED ORDER — DEXAMETHASONE SODIUM PHOSPHATE 10 MG/ML IJ SOLN
INTRAMUSCULAR | Status: DC | PRN
  Administered 2024-02-18: 10 mg via INTRAVENOUS

## 2024-02-18 MED ORDER — LIDOCAINE-EPINEPHRINE (PF) 2 %-1:200000 IJ SOLN
INTRAMUSCULAR | Status: DC | PRN
  Administered 2024-02-18 (×3): 5 mL via EPIDURAL

## 2024-02-18 MED ORDER — SODIUM CHLORIDE 0.9 % IR SOLN
Status: DC | PRN
  Administered 2024-02-18: 1

## 2024-02-18 MED ORDER — OXYTOCIN-SODIUM CHLORIDE 30-0.9 UT/500ML-% IV SOLN
1.0000 m[IU]/min | INTRAVENOUS | Status: DC
  Administered 2024-02-18: 2 m[IU]/min via INTRAVENOUS
  Administered 2024-02-18: 8 m[IU]/min via INTRAVENOUS

## 2024-02-18 MED ORDER — OXYTOCIN BOLUS FROM INFUSION: 333.0000 mL | Freq: Once | INTRAVENOUS | Status: DC

## 2024-02-18 MED ORDER — FENTANYL CITRATE (PF) 100 MCG/2ML IJ SOLN: 25.0000 ug | INTRAMUSCULAR | Status: DC | PRN

## 2024-02-18 MED ORDER — ONDANSETRON HCL 4 MG/2ML IJ SOLN
INTRAMUSCULAR | Status: DC | PRN
  Administered 2024-02-18: 4 mg via INTRAVENOUS

## 2024-02-18 MED ORDER — KETOROLAC TROMETHAMINE 30 MG/ML IJ SOLN
30.0000 mg | Freq: Once | INTRAMUSCULAR | Status: AC | PRN
  Administered 2024-02-19: 30 mg via INTRAVENOUS

## 2024-02-18 MED ORDER — FENTANYL CITRATE (PF) 100 MCG/2ML IJ SOLN
INTRAMUSCULAR | Status: DC | PRN
  Administered 2024-02-18: 100 ug via EPIDURAL

## 2024-02-18 MED ORDER — SODIUM CHLORIDE 0.9 % IV SOLN
INTRAVENOUS | Status: DC | PRN
  Administered 2024-02-18: 500 mg via INTRAVENOUS

## 2024-02-18 MED ORDER — MORPHINE SULFATE (PF) 0.5 MG/ML IJ SOLN
INTRAMUSCULAR | Status: AC
  Filled 2024-02-18: qty 10

## 2024-02-18 MED ORDER — ACETAMINOPHEN 325 MG PO TABS: 650.0000 mg | ORAL_TABLET | ORAL | Status: DC | PRN

## 2024-02-18 MED ORDER — OXYTOCIN-SODIUM CHLORIDE 30-0.9 UT/500ML-% IV SOLN
INTRAVENOUS | Status: DC | PRN
Start: 2024-02-18 — End: 2024-02-19
  Administered 2024-02-18: 300 mL via INTRAVENOUS

## 2024-02-18 MED ORDER — SODIUM CHLORIDE 0.9 % IV SOLN: 500.0000 mg | INTRAVENOUS | Status: DC

## 2024-02-18 MED ORDER — LACTATED RINGERS IV SOLN: INTRAVENOUS | Status: DC | PRN

## 2024-02-18 MED ORDER — TRANEXAMIC ACID-NACL 1000-0.7 MG/100ML-% IV SOLN
INTRAVENOUS | Status: DC | PRN
  Administered 2024-02-18: 1000 mg via INTRAVENOUS

## 2024-02-18 MED ORDER — FENTANYL CITRATE (PF) 100 MCG/2ML IJ SOLN
50.0000 ug | INTRAMUSCULAR | Status: DC | PRN
  Administered 2024-02-18: 100 ug via INTRAVENOUS
  Filled 2024-02-18: qty 2

## 2024-02-18 SURGICAL SUPPLY — 29 items
BENZOIN TINCTURE PRP APPL 2/3 (GAUZE/BANDAGES/DRESSINGS) ×1 IMPLANT
CHLORAPREP W/TINT 26 (MISCELLANEOUS) ×2 IMPLANT
CLAMP UMBILICAL CORD (MISCELLANEOUS) ×1 IMPLANT
CLOTH BEACON ORANGE TIMEOUT ST (SAFETY) ×1 IMPLANT
DRSG OPSITE POSTOP 4X10 (GAUZE/BANDAGES/DRESSINGS) ×1 IMPLANT
ELECTRODE REM PT RTRN 9FT ADLT (ELECTROSURGICAL) ×1 IMPLANT
EXTRACTOR VACUUM M CUP 4 TUBE (SUCTIONS) IMPLANT
GAUZE PAD ABD 7.5X8 STRL (GAUZE/BANDAGES/DRESSINGS) IMPLANT
GAUZE SPONGE 4X4 12PLY STRL LF (GAUZE/BANDAGES/DRESSINGS) IMPLANT
GLOVE BIOGEL PI IND STRL 7.0 (GLOVE) ×3 IMPLANT
GLOVE ECLIPSE 7.0 STRL STRAW (GLOVE) ×1 IMPLANT
GOWN STRL REUS W/TWL LRG LVL3 (GOWN DISPOSABLE) ×2 IMPLANT
KIT ABG SYR 3ML LUER SLIP (SYRINGE) ×1 IMPLANT
NDL HYPO 25X5/8 SAFETYGLIDE (NEEDLE) ×1 IMPLANT
NEEDLE HYPO 22GX1.5 SAFETY (NEEDLE) ×1 IMPLANT
NEEDLE HYPO 25X5/8 SAFETYGLIDE (NEEDLE) ×1 IMPLANT
NS IRRIG 1000ML POUR BTL (IV SOLUTION) ×1 IMPLANT
PACK C SECTION WH (CUSTOM PROCEDURE TRAY) ×1 IMPLANT
PAD ABD 7.5X8 STRL (GAUZE/BANDAGES/DRESSINGS) ×1 IMPLANT
PAD OB MATERNITY 4.3X12.25 (PERSONAL CARE ITEMS) ×1 IMPLANT
RTRCTR C-SECT PINK 25CM LRG (MISCELLANEOUS) ×1 IMPLANT
STRIP CLOSURE SKIN 1/2X4 (GAUZE/BANDAGES/DRESSINGS) ×1 IMPLANT
SUT MNCRL 0 VIOLET CTX 36 (SUTURE) ×2 IMPLANT
SUT VIC AB 0 CTX36XBRD ANBCTRL (SUTURE) ×1 IMPLANT
SUT VIC AB 4-0 KS 27 (SUTURE) ×1 IMPLANT
SYR 30ML LL (SYRINGE) ×1 IMPLANT
TOWEL OR 17X24 6PK STRL BLUE (TOWEL DISPOSABLE) ×1 IMPLANT
TRAY FOLEY W/BAG SLVR 14FR LF (SET/KITS/TRAYS/PACK) ×1 IMPLANT
WATER STERILE IRR 1000ML POUR (IV SOLUTION) ×1 IMPLANT

## 2024-02-18 NOTE — Progress Notes (Signed)
 LABOR PROGRESS NOTE  Patient Name: Breanna Soto, female   DOB: [birth year], 18 y.o.  MRN: [id number]  [facility] bedside for recurrent variables.  Pt agreeable for IUPC and Amnioinfusion.  Placed posterior to avoid anterior placenta, bolus 300cc with rate of 150cc/hr. Pt and baby tolerated well.  Halfed pitocin to 5U from 10U.   Augustin JAYSON Slade, MD

## 2024-02-18 NOTE — Progress Notes (Addendum)
 LABOR PROGRESS NOTE  Patient Name: Breanna Soto, female   DOB: 2006-03-25, 18 y.o.  MRN: [id number]  Called to bedside for prolonged deceleration. Required position changes, bolus, cessation of pit, terbutaline for full recovery. Cervix unchanged; no bleeding or cord palpated. Discussed with patient indication for primary cesarean given fetal intolerance and inability to have enough contractions to produce cervical change. She is amenable.  Aideen Fenster has agreed to proceed with cesarean section due to Non-reassuring FHR. The risks of surgery were discussed with the patient including but were not limited to: bleeding which may require transfusion or reoperation; infection which may require antibiotics; injury to bowel, bladder, ureters or other surrounding organs; injury to the fetus; need for additional procedures including hysterectomy in the event of a life-threatening hemorrhage; formation of adhesions; placental abnormalities wth subsequent pregnancies; incisional problems; thromboembolic phenomenon and other postoperative/anesthesia complications. The patient concurred with the proposed plan, giving informed written consent for the procedure. Patient will remain NPO for procedure. Anesthesia and OR aware. Preoperative prophylactic antibiotics and SCDs ordered on call to the OR. To OR when ready.   Almarie CHRISTELLA Moats, MD

## 2024-02-18 NOTE — Progress Notes (Signed)
 LABOR PROGRESS NOTE  Patient Name: Breanna Soto, female   DOB: [birth year], 18 y.o.  MRN: [id number]  Pt comfortable after epidural, Cat 1 strip.  Pit previously turned down to 3U with attending.  Will continue Amnio and Pit titration to adequate CTX vs fetal tolerance.   Augustin JAYSON Slade, MD

## 2024-02-18 NOTE — Progress Notes (Signed)
 Event Date/Time   First Provider Initiated Contact with Patient 02/18/24 0025       S: Ms. Breanna Soto is a 18 y.o. G1P0000 at [redacted]w[redacted]d  who presents to MAU today complaining of leaking of fluid since 2100. She denies vaginal bleeding. She endorses contractions. She reports normal fetal movement.    O: BP (!) 140/90   Pulse 69   Temp 98.6 F (37 C)   Resp 17   Ht 5' 6 (1.676 m)   Wt 100.7 kg   LMP 05/25/2023 (Exact Date)   SpO2 99%   BMI 35.83 kg/m   Vitals:   02/18/24 0005 02/18/24 0015 02/18/24 0030 02/18/24 0045  BP: (!) 143/89 (!) 140/90 (!) 150/104 139/86  Pulse: 72 69 80 72  Resp:      Temp:      SpO2: 100% 99% 100% 100%  Weight:      Height:        GENERAL: Well-developed, well-nourished female in no acute distress.  HEAD: Normocephalic, atraumatic.  CHEST: Normal effort of breathing, regular heart rate ABDOMEN: Soft, nontender, gravid PELVIC: Normal external female genitalia. Vagina is pink and rugated. Cervix with normal contour, no lesions. Normal discharge.  Small amt pooling. Fern Collected  Cervical exam: Deferred    Fetal Monitoring: FHT: 130 bpm, Mod Var, -Decels, +Accels Toco: Q2-74min   A: SIUP at [redacted]w[redacted]d  SROM Elevated bps  P: Fern positive L&D team unavailable currently Admit orders placed with PreE labs.   Synthia Raisin, CNM 02/18/2024 12:26 AM

## 2024-02-18 NOTE — H&P (Signed)
 LABOR AND DELIVERY ADMISSION HISTORY AND PHYSICAL NOTE  Breanna Soto is a 18 y.o. female G1P0000 with IUP at [redacted]w[redacted]d presenting for SROM 7/1 2145. Incidentally found to have elevated Bps as well.   Patient reports the fetal movement as active. Patient reports uterine contraction activity as irregular. Patient reports  vaginal bleeding as none. Patient describes fluid per vagina as Clear.   Patient denies headache, vision changes, chest pain, shortness of breath, right upper quadrant pain, or LE edema.  She plans on breast feeding and bottle feeding. Her contraception plan is: unsure.  Prenatal History/Complications: PNC at [facility]  Sono:  @[redacted]w[redacted]d , CWD, normal anatomy, breech presentation, anterior placenta, 59%ile  Pregnancy complications:  Patient Active Problem List   Diagnosis Date Noted   Alpha thalassemia silent carrier 02/18/2024   Premature rupture of membranes 02/18/2024   Abnormal chromosomal and genetic finding on antenatal screening mother 09/01/2023   Supervision of normal first pregnancy 08/19/2023    Past Medical History: Past Medical History:  Diagnosis Date   Medical history non-contributory     Past Surgical History: Past Surgical History:  Procedure Laterality Date   cyst excision on neck      Obstetrical History: OB History     Gravida  1   Para  0   Term  0   Preterm  0   AB  0   Living  0      SAB  0   IAB  0   Ectopic  0   Multiple  0   Live Births  0           Social History: Social History   Socioeconomic History   Marital status: Single    Spouse name: Not on file   Number of children: Not on file   Years of education: Not on file   Highest education level: Not on file  Occupational History   Not on file  Tobacco Use   Smoking status: Passive Smoke Exposure - Never Smoker   Smokeless tobacco: Never  Vaping Use   Vaping status: Former  Substance and Sexual Activity   Alcohol use: No   Drug use: No    Sexual activity: Yes    Birth control/protection: None  Other Topics Concern   Not on file  Social History Narrative   Not on file   Social Drivers of Health   Financial Resource Strain: Low Risk  (05/07/2023)   Overall Financial Resource Strain (CARDIA)    Difficulty of Paying Living Expenses: Not very hard  Food Insecurity: No Food Insecurity (02/18/2024)   Hunger Vital Sign    Worried About Running Out of Food in the Last Year: Never true    Ran Out of Food in the Last Year: Never true  Transportation Needs: No Transportation Needs (02/18/2024)   PRAPARE - Administrator, Civil Service (Medical): No    Lack of Transportation (Non-Medical): No  Physical Activity: Insufficiently Active (05/07/2023)   Exercise Vital Sign    Days of Exercise per Week: 4 days    Minutes of Exercise per Session: 20 min  Stress: Stress Concern Present (05/07/2023)   [employer] of Occupational Health - Occupational Stress Questionnaire    Feeling of Stress : Rather much  Social Connections: Socially Isolated (05/07/2023)   Social Connection and Isolation Panel    Frequency of Communication with Friends and Family: Twice a week    Frequency of Social Gatherings with Friends and Family: Never  Attends Religious Services: More than 4 times per year    Active Member of Clubs or Organizations: No    Attends Banker Meetings: Never    Marital Status: Never married    Family History: Family History  Problem Relation Age of Onset   Asthma Brother    Diabetes Maternal Grandfather     Allergies: No Known Allergies  Medications Prior to Admission  Medication Sig Dispense Refill Last Dose/Taking   hydrocortisone  (ANUSOL -HC) 2.5 % rectal cream Place rectally 2 (two) times daily. 30 g 2 Past Week   prenatal vitamin w/FE, FA (PRENATAL 1 + 1) 27-1 MG TABS tablet Take 1 tablet by mouth daily at 12 noon. 30 tablet 12 02/17/2024   Acetaminophen  (TYLENOL  PO) Take by mouth. (Patient  not taking: Reported on 02/16/2024)      aspirin  EC 81 MG tablet Take 1 tablet (81 mg total) by mouth daily. Swallow whole. (Patient not taking: Reported on 02/16/2024) 90 tablet 3    Blood Pressure Monitor MISC For regular home bp monitoring during pregnancy 1 each 0    docusate sodium  (COLACE) 100 MG capsule Take 1 capsule (100 mg total) by mouth 2 (two) times daily. (Patient not taking: Reported on 02/16/2024) 10 capsule 0      Review of Systems  All systems reviewed and negative except as stated in HPI  Physical Exam BP (!) 133/92   Pulse 72   Temp 98.1 F (36.7 C) (Oral)   Resp 18   Ht 5' 6 (1.676 m)   Wt 100.7 kg   LMP 05/25/2023 (Exact Date)   SpO2 100%   BMI 35.83 kg/m   Physical Exam Constitutional:      General: She is not in acute distress.    Appearance: She is not ill-appearing.  Cardiovascular:     Rate and Rhythm: Normal rate.  Pulmonary:     Effort: Pulmonary effort is normal.  Abdominal:     Comments: Gravid  Musculoskeletal:        General: No swelling.  Skin:    General: Skin is warm and dry.  Neurological:     General: No focal deficit present.  Psychiatric:        Mood and Affect: Mood normal.   Presentation: cephalic by BSUS  Fetal monitoring: Baseline: 140 bpm, Variability: Good {> 6 bpm), Accelerations: Reactive, and Decelerations: Absent Uterine activity: Q2-5  Dilation: 1 Effacement (%): 50 Station: -2 Presentation: Vertex Exam by:: Kent Solian RN  Prenatal labs: ABO, Rh: --/--/B POS (07/02 0058) Antibody: NEG (07/02 0058) Rubella: 1.48 (01/02 1409) RPR: Non Reactive (04/21 0844)  HBsAg: Negative (01/02 1409)  HIV: Non Reactive (04/21 0844)  GC/Chlamydia:  Neisseria Gonorrhea  Date Value Ref Range Status  02/02/2024 Negative  Final   Chlamydia  Date Value Ref Range Status  02/02/2024 Negative  Final   GBS: Positive/-- (06/16 1530)   Prenatal Transfer Tool  Maternal Diabetes: No Genetic Screening: Normal Maternal  Ultrasounds/Referrals: Normal Fetal Ultrasounds or other Referrals:  None Maternal Substance Abuse:  No Significant Maternal Medications:  None Significant Maternal Lab Results: Group B Strep positive  Results for orders placed or performed during the hospital encounter of 02/17/24 (from the past 24 hours)  POCT fern test   Collection Time: 02/18/24 12:40 AM  Result Value Ref Range   POCT Fern Test Positive = ruptured amniotic membanes   CBC   Collection Time: 02/18/24 12:58 AM  Result Value Ref Range   WBC 9.7  4.0 - 10.5 K/uL   RBC 4.48 3.87 - 5.11 MIL/uL   Hemoglobin 12.4 12.0 - 15.0 g/dL   HCT 64.3 (L) 63.9 - 53.9 %   MCV 79.5 (L) 80.0 - 100.0 fL   MCH 27.7 26.0 - 34.0 pg   MCHC 34.8 30.0 - 36.0 g/dL   RDW 86.0 88.4 - 84.4 %   Platelets 173 150 - 400 K/uL   nRBC 0.0 0.0 - 0.2 %  Comprehensive metabolic panel   Collection Time: 02/18/24 12:58 AM  Result Value Ref Range   Sodium 137 135 - 145 mmol/L   Potassium 3.8 3.5 - 5.1 mmol/L   Chloride 112 (H) 98 - 111 mmol/L   CO2 18 (L) 22 - 32 mmol/L   Glucose, Bld 86 70 - 99 mg/dL   BUN 7 6 - 20 mg/dL   Creatinine, Ser 9.23 0.44 - 1.00 mg/dL   Calcium 8.8 (L) 8.9 - 10.3 mg/dL   Total Protein 6.3 (L) 6.5 - 8.1 g/dL   Albumin 2.7 (L) 3.5 - 5.0 g/dL   AST 25 15 - 41 U/L   ALT 18 0 - 44 U/L   Alkaline Phosphatase 117 38 - 126 U/L   Total Bilirubin 0.5 0.0 - 1.2 mg/dL   GFR, Estimated >39 >39 mL/min   Anion gap 7 5 - 15  Type and screen [facility]   Collection Time: 02/18/24 12:58 AM  Result Value Ref Range   ABO/RH(D) B POS    Antibody Screen NEG    Sample Expiration      02/21/2024,2359 Performed at [facility], [address], [place], [place] [address]   Protein / creatinine ratio, urine   Collection Time: 02/18/24  1:00 AM  Result Value Ref Range   Creatinine, Urine 152 mg/dL   Total Protein, Urine 13 mg/dL   Protein Creatinine Ratio 0.09 0.00 - 0.15 mg/mg[Cre]    Assessment: Breanna Soto is a 18 y.o. G1P0000 at [redacted]w[redacted]d here for SROM 7/1 2145. Incidentally ruled-in for gHTN as well.  #Labor: Patient having irregular contractions. Describes them as tightening; not uncomfortable or painful. Discussed recommendation for augmentation given ~5 hours of rupture and no regular contractions. She is amenable. Start pit 2x2 #Pain: IV pain meds PRN, epidural upon request #FHT: Category I #GBS/ID: Positive > PCN #MOF: breast feeding and bottle feeding #MOC: unsure #Circ: Yes  #gHTN: new diagnosis. No s/sx of preE. Normal labs at admission. Lasix/K PP  Breanna CHRISTELLA Moats, MD [facility] Fellow [facility], [facility]  02/18/2024, 2:30 AM

## 2024-02-18 NOTE — Anesthesia Preprocedure Evaluation (Signed)
 Anesthesia Evaluation  Patient identified by MRN, date of birth, ID band Patient awake    Reviewed: Allergy & Precautions, Patient's Chart, lab work & pertinent test results  History of Anesthesia Complications Negative for: history of anesthetic complications  Airway Mallampati: II  TM Distance: >3 FB Neck ROM: Full    Dental no notable dental hx.    Pulmonary neg pulmonary ROS   Pulmonary exam normal        Cardiovascular negative cardio ROS Normal cardiovascular exam     Neuro/Psych negative neurological ROS  negative psych ROS   GI/Hepatic negative GI ROS, Neg liver ROS,,,  Endo/Other  negative endocrine ROS    Renal/GU negative Renal ROS  negative genitourinary   Musculoskeletal negative musculoskeletal ROS (+)    Abdominal   Peds  Hematology negative hematology ROS (+)   Anesthesia Other Findings Day of surgery medications reviewed with patient.  Reproductive/Obstetrics (+) Pregnancy                             Anesthesia Physical Anesthesia Plan  ASA: 2  Anesthesia Plan: Epidural   Post-op Pain Management:    Induction:   PONV Risk Score and Plan: Treatment may vary due to age or medical condition  Airway Management Planned: Natural Airway  Additional Equipment: Fetal Monitoring  Intra-op Plan:   Post-operative Plan:   Informed Consent: I have reviewed the patients History and Physical, chart, labs and discussed the procedure including the risks, benefits and alternatives for the proposed anesthesia with the patient or authorized representative who has indicated his/her understanding and acceptance.       Plan Discussed with:   Anesthesia Plan Comments:        Anesthesia Quick Evaluation

## 2024-02-18 NOTE — Progress Notes (Signed)
 LABOR PROGRESS NOTE  Patient Name: Breanna Soto, female   DOB: 2005-10-31, 18 y.o.  MRN: [id number]  Cat 1 strip, nursing reports bulging fore bag. Spoke with pt and she is agreeable to SVE and AROM.  AROM of fore bag, tolerated well by mom and baby, clear fluid.  Pit as needed.   Augustin JAYSON Slade, MD

## 2024-02-18 NOTE — Anesthesia Procedure Notes (Signed)
 Epidural Patient location during procedure: OB Start time: 02/18/2024 5:03 PM End time: 02/18/2024 5:13 PM  Staffing Anesthesiologist: Paul Lamarr BRAVO, MD Performed: anesthesiologist   Preanesthetic Checklist Completed: patient identified, IV checked, risks and benefits discussed, monitors and equipment checked, pre-op evaluation and timeout performed  Epidural Patient position: sitting Prep: DuraPrep and site prepped and draped Patient monitoring: continuous pulse ox, blood pressure and heart rate Approach: midline Location: L3-L4 Injection technique: LOR air  Needle:  Needle type: Tuohy  Needle gauge: 17 G Needle length: 9 cm Needle insertion depth: 7 cm Catheter type: closed end flexible Catheter size: 19 Gauge Catheter at skin depth: 12 cm Test dose: negative and Other (1% lidocaine)  Assessment Events: blood not aspirated, no cerebrospinal fluid, injection not painful, no injection resistance, no paresthesia and negative IV test  Additional Notes Patient identified. Risks, benefits, and alternatives discussed with patient including but not limited to bleeding, infection, nerve damage, paralysis, failed block, incomplete pain control, headache, blood pressure changes, nausea, vomiting, reactions to medication, itching, and postpartum back pain. Confirmed with bedside nurse the patient's most recent platelet count. Confirmed with patient that they are not currently taking any anticoagulation, have any bleeding history, or any family history of bleeding disorders. Patient expressed understanding and wished to proceed. All questions were answered. Sterile technique was used throughout the entire procedure. Please see nursing notes for vital signs.   Difficult placement, 4 attempts required. Test dose was given through epidural catheter and negative prior to continuing to dose epidural or start infusion. Warning signs of high block given to the patient including shortness of breath,  tingling/numbness in hands, complete motor block, or any concerning symptoms with instructions to call for help. Patient was given instructions on fall risk and not to get out of bed. All questions and concerns addressed with instructions to call with any issues or inadequate analgesia.  Reason for block:procedure for pain

## 2024-02-18 NOTE — Progress Notes (Signed)
 LABOR PROGRESS NOTE  Patient Name: Breanna Soto, female   DOB: Nov 12, 2005, 19 y.o.  MRN: [id number]  To bedside for prolonged deceleration. Resolved with position changes. Quickly returned to moderate variability. Prolonged accelerations prior. Cat I now. Continue to titrate pit to adequate contractions.  Almarie CHRISTELLA Moats, MD

## 2024-02-18 NOTE — MAU Note (Signed)

## 2024-02-18 NOTE — Progress Notes (Signed)
 LABOR PROGRESS NOTE  Patient Name: Breanna Soto, female   DOB: [birth year], 18 y.o.  MRN: [id number]  Pt still with some intermittent variable decelerations however good variability.  Pt agreeable to SVE.  SVE 6/90/-1, accels with fetal scalp .  MVUs ~160s with Pit of 5U.  Will continue to titrate pit as tolerated.    Augustin JAYSON Slade, MD

## 2024-02-19 ENCOUNTER — Other Ambulatory Visit: Payer: Self-pay

## 2024-02-19 ENCOUNTER — Encounter (HOSPITAL_COMMUNITY): Payer: Self-pay | Admitting: Obstetrics and Gynecology

## 2024-02-19 DIAGNOSIS — O4202 Full-term premature rupture of membranes, onset of labor within 24 hours of rupture: Secondary | ICD-10-CM

## 2024-02-19 DIAGNOSIS — Z98891 History of uterine scar from previous surgery: Secondary | ICD-10-CM

## 2024-02-19 DIAGNOSIS — O99820 Streptococcus B carrier state complicating pregnancy: Secondary | ICD-10-CM

## 2024-02-19 DIAGNOSIS — O139 Gestational [pregnancy-induced] hypertension without significant proteinuria, unspecified trimester: Secondary | ICD-10-CM

## 2024-02-19 DIAGNOSIS — Z3A38 38 weeks gestation of pregnancy: Secondary | ICD-10-CM

## 2024-02-19 DIAGNOSIS — O134 Gestational [pregnancy-induced] hypertension without significant proteinuria, complicating childbirth: Secondary | ICD-10-CM

## 2024-02-19 LAB — CBC
HCT: 33.1 % — ABNORMAL LOW (ref 36.0–46.0)
HCT: 36.3 % (ref 36.0–46.0)
Hemoglobin: 11.5 g/dL — ABNORMAL LOW (ref 12.0–15.0)
Hemoglobin: 12.4 g/dL (ref 12.0–15.0)
MCH: 27.5 pg (ref 26.0–34.0)
MCH: 27.5 pg (ref 26.0–34.0)
MCHC: 34.2 g/dL (ref 30.0–36.0)
MCHC: 34.7 g/dL (ref 30.0–36.0)
MCV: 79.2 fL — ABNORMAL LOW (ref 80.0–100.0)
MCV: 80.5 fL (ref 80.0–100.0)
Platelets: 162 10*3/uL (ref 150–400)
Platelets: 178 10*3/uL (ref 150–400)
RBC: 4.18 MIL/uL (ref 3.87–5.11)
RBC: 4.51 MIL/uL (ref 3.87–5.11)
RDW: 13.6 % (ref 11.5–15.5)
RDW: 13.6 % (ref 11.5–15.5)
WBC: 16.1 10*3/uL — ABNORMAL HIGH (ref 4.0–10.5)
WBC: 19.1 10*3/uL — ABNORMAL HIGH (ref 4.0–10.5)
nRBC: 0 % (ref 0.0–0.2)
nRBC: 0 % (ref 0.0–0.2)

## 2024-02-19 MED ORDER — NIFEDIPINE ER OSMOTIC RELEASE 30 MG PO TB24
30.0000 mg | ORAL_TABLET | Freq: Every day | ORAL | Status: DC
  Administered 2024-02-19: 30 mg via ORAL
  Filled 2024-02-19: qty 1

## 2024-02-19 MED ORDER — IBUPROFEN 800 MG PO TABS
800.0000 mg | ORAL_TABLET | Freq: Three times a day (TID) | ORAL | Status: DC
  Administered 2024-02-19 – 2024-02-20 (×3): 800 mg via ORAL
  Filled 2024-02-19 (×3): qty 1

## 2024-02-19 MED ORDER — NIFEDIPINE ER OSMOTIC RELEASE 30 MG PO TB24
30.0000 mg | ORAL_TABLET | Freq: Two times a day (BID) | ORAL | Status: DC
  Administered 2024-02-19 – 2024-02-20 (×2): 30 mg via ORAL
  Filled 2024-02-19 (×2): qty 1

## 2024-02-19 MED ORDER — SENNOSIDES-DOCUSATE SODIUM 8.6-50 MG PO TABS
2.0000 | ORAL_TABLET | Freq: Every day | ORAL | Status: DC
  Administered 2024-02-20: 2 via ORAL
  Filled 2024-02-19: qty 2

## 2024-02-19 MED ORDER — COCONUT OIL OIL: 1.0000 | TOPICAL_OIL | Status: DC | PRN

## 2024-02-19 MED ORDER — OXYTOCIN-SODIUM CHLORIDE 30-0.9 UT/500ML-% IV SOLN: 2.5000 [IU]/h | INTRAVENOUS | Status: AC

## 2024-02-19 MED ORDER — DIPHENHYDRAMINE HCL 50 MG/ML IJ SOLN
12.5000 mg | Freq: Once | INTRAMUSCULAR | Status: AC
  Administered 2024-02-19: 12.5 mg via INTRAVENOUS
  Filled 2024-02-19: qty 1

## 2024-02-19 MED ORDER — WITCH HAZEL-GLYCERIN EX PADS: 1.0000 | MEDICATED_PAD | CUTANEOUS | Status: DC | PRN

## 2024-02-19 MED ORDER — OXYCODONE HCL 5 MG PO TABS
5.0000 mg | ORAL_TABLET | ORAL | Status: DC | PRN
  Administered 2024-02-19: 5 mg via ORAL
  Filled 2024-02-19: qty 1

## 2024-02-19 MED ORDER — MEDROXYPROGESTERONE ACETATE 150 MG/ML IM SUSP: 150.0000 mg | INTRAMUSCULAR | Status: DC | PRN

## 2024-02-19 MED ORDER — DIPHENHYDRAMINE HCL 25 MG PO CAPS
25.0000 mg | ORAL_CAPSULE | Freq: Four times a day (QID) | ORAL | Status: DC | PRN
  Administered 2024-02-19 (×3): 25 mg via ORAL
  Filled 2024-02-19 (×3): qty 1

## 2024-02-19 MED ORDER — MEASLES, MUMPS & RUBELLA VAC IJ SOLR: 0.5000 mL | Freq: Once | INTRAMUSCULAR | Status: DC

## 2024-02-19 MED ORDER — KETOROLAC TROMETHAMINE 30 MG/ML IJ SOLN
30.0000 mg | Freq: Four times a day (QID) | INTRAMUSCULAR | Status: AC
  Administered 2024-02-19 (×3): 30 mg via INTRAVENOUS
  Filled 2024-02-19 (×3): qty 1

## 2024-02-19 MED ORDER — ENOXAPARIN SODIUM 40 MG/0.4ML IJ SOSY
40.0000 mg | PREFILLED_SYRINGE | INTRAMUSCULAR | Status: DC
  Administered 2024-02-19: 40 mg via SUBCUTANEOUS
  Filled 2024-02-19: qty 0.4

## 2024-02-19 MED ORDER — ACETAMINOPHEN 500 MG PO TABS
1000.0000 mg | ORAL_TABLET | Freq: Three times a day (TID) | ORAL | Status: DC
Start: 2024-02-19 — End: 2024-02-20
  Administered 2024-02-19 – 2024-02-20 (×6): 1000 mg via ORAL
  Filled 2024-02-19 (×6): qty 2

## 2024-02-19 MED ORDER — DIBUCAINE (PERIANAL) 1 % EX OINT: 1.0000 | TOPICAL_OINTMENT | CUTANEOUS | Status: DC | PRN

## 2024-02-19 MED ORDER — ZOLPIDEM TARTRATE 5 MG PO TABS: 5.0000 mg | ORAL_TABLET | Freq: Every evening | ORAL | Status: DC | PRN

## 2024-02-19 MED ORDER — FUROSEMIDE 20 MG PO TABS
40.0000 mg | ORAL_TABLET | Freq: Every day | ORAL | Status: DC
  Administered 2024-02-20: 40 mg via ORAL
  Filled 2024-02-19: qty 2

## 2024-02-19 MED ORDER — MENTHOL 3 MG MT LOZG: 1.0000 | LOZENGE | OROMUCOSAL | Status: DC | PRN

## 2024-02-19 MED ORDER — PRENATAL MULTIVITAMIN CH
1.0000 | ORAL_TABLET | Freq: Every day | ORAL | Status: DC
  Administered 2024-02-19 – 2024-02-20 (×2): 1 via ORAL
  Filled 2024-02-19 (×2): qty 1

## 2024-02-19 MED ORDER — KETOROLAC TROMETHAMINE 30 MG/ML IJ SOLN
INTRAMUSCULAR | Status: AC
  Filled 2024-02-19: qty 1

## 2024-02-19 MED ORDER — SIMETHICONE 80 MG PO CHEW: 80.0000 mg | CHEWABLE_TABLET | ORAL | Status: DC | PRN

## 2024-02-19 MED ORDER — SIMETHICONE 80 MG PO CHEW
80.0000 mg | CHEWABLE_TABLET | Freq: Three times a day (TID) | ORAL | Status: DC
Start: 2024-02-19 — End: 2024-02-20
  Administered 2024-02-19 – 2024-02-20 (×5): 80 mg via ORAL
  Filled 2024-02-19 (×5): qty 1

## 2024-02-19 MED ORDER — GABAPENTIN 100 MG PO CAPS
100.0000 mg | ORAL_CAPSULE | Freq: Three times a day (TID) | ORAL | Status: DC
  Administered 2024-02-19 – 2024-02-20 (×5): 100 mg via ORAL
  Filled 2024-02-19 (×5): qty 1

## 2024-02-19 MED ORDER — POTASSIUM CHLORIDE CRYS ER 20 MEQ PO TBCR
20.0000 meq | EXTENDED_RELEASE_TABLET | Freq: Every day | ORAL | Status: DC
  Administered 2024-02-20: 20 meq via ORAL
  Filled 2024-02-19: qty 1

## 2024-02-19 NOTE — Progress Notes (Signed)
 POSTPARTUM PROGRESS NOTE  Subjective: Breanna Soto is a 18 y.o. G1P1001 s/p pLTCS at [redacted]w[redacted]d.  She reports she is doing well. No acute events overnight. She denies any problems with  or po intake. Foley still in place. Denies nausea or vomiting. She has passed flatus. Pain is well controlled.  Lochia is moderate.  Objective: Blood pressure (!) 143/85, pulse 83, temperature 98.4 F (36.9 C), temperature source Oral, resp. rate 16, height 5' 6 (1.676 m), weight 100.7 kg, last menstrual period 05/25/2023, SpO2 100%, unknown if currently breastfeeding.  Physical Exam:  General: alert, cooperative and no distress Chest: no respiratory distress Abdomen: soft, non-tender  Uterine Fundus: firm and at level of umbilicus Extremities: No calf swelling or tenderness  Trace edema  Recent Labs    02/19/24 0044 02/19/24 0423  HGB 12.4 11.5*  HCT 36.3 33.1*    Assessment/Plan: Breanna Soto is a 18 y.o. G1P1001 s/p pLTCS at [redacted]w[redacted]d for non-reassuring fetal status.  Routine Postpartum Care: Doing well, pain well-controlled.  -- Continue routine care, lactation support  -- Contraception: unsure -- Feeding: both  -- gHTN: Mild range pressures. Lasix/K x5 days PP. Started Procardia XL 30 mg every day this AM  Dispo: Plan for discharge 7/4-7/5.  Nicholaus Almarie HERO, MD OB Fellow 02/19/2024 6:27 AM

## 2024-02-19 NOTE — Anesthesia Postprocedure Evaluation (Signed)
 Anesthesia Post Note  Patient: Breanna Soto  Procedure(s) Performed: CESAREAN DELIVERY     Patient location during evaluation: PACU Anesthesia Type: Epidural Level of consciousness: awake Pain management: pain level controlled Vital Signs Assessment: post-procedure vital signs reviewed and stable Respiratory status: spontaneous breathing, nonlabored ventilation and respiratory function stable Cardiovascular status: stable Postop Assessment: no headache, no backache and epidural receding Anesthetic complications: no   No notable events documented.  Last Vitals:  Vitals:   02/19/24 0045 02/19/24 0100  BP: 129/71 (!) 135/95  Pulse: 98 94  Resp: 18 16  Temp:    SpO2: 100% 100%    Last Pain:  Vitals:   02/19/24 0100  TempSrc:   PainSc: 0-No pain   Pain Goal: Patients Stated Pain Goal: 9 (02/18/24 1330)  LLE Motor Response: Purposeful movement (02/19/24 0100) LLE Sensation: Numbness (02/19/24 0100) RLE Motor Response: Purposeful movement (02/19/24 0100) RLE Sensation: Numbness (02/19/24 0100)     Epidural/Spinal Function Cutaneous sensation: Able to Wiggle Toes (02/19/24 0100), Patient able to flex knees: Yes (02/19/24 0100), Patient able to lift hips off bed: No (02/19/24 0100), Back pain beyond tenderness at insertion site: No (02/19/24 0100), Progressively worsening motor and/or sensory loss: No (02/19/24 0100), Bowel and/or bladder incontinence post epidural: No (02/19/24 0100)  Delon Aisha Arch

## 2024-02-19 NOTE — Transfer of Care (Signed)
 Immediate Anesthesia Transfer of Care Note  Patient: Breanna Soto  Procedure(s) Performed: CESAREAN DELIVERY  Patient Location: PACU  Anesthesia Type:Epidural  Level of Consciousness: awake, alert , and oriented  Airway & Oxygen Therapy: Patient Spontanous Breathing  Post-op Assessment: Report given to RN and Post -op Vital signs reviewed and stable  Post vital signs: Reviewed and stable  Last Vitals:  Vitals Value Taken Time  BP 113/79 02/19/24 00:30  Temp 36.6 C 02/19/24 00:22  Pulse 104 02/19/24 00:33  Resp 17 02/19/24 00:33  SpO2 100 % 02/19/24 00:33  Vitals shown include unfiled device data.  Last Pain:  Vitals:   02/19/24 0030  TempSrc:   PainSc: 0-No pain      Patients Stated Pain Goal: 9 (02/18/24 1330)  Complications: No notable events documented.

## 2024-02-19 NOTE — Plan of Care (Signed)

## 2024-02-19 NOTE — Discharge Summary (Signed)
 Postpartum Discharge Summary  Date of Service updated 02/20/2024     Patient Name: Breanna Soto DOB: 2005-08-25 MRN: [id number]  Date of admission: 02/17/2024 Delivery date:02/18/2024 Delivering provider: FREDIRICK GLENYS RAMAN Date of discharge: 02/20/2024  Admitting diagnosis: Premature rupture of membranes [O42.90] Intrauterine pregnancy: [redacted]w[redacted]d     Secondary diagnosis:  Principal Problem:   Status post primary low transverse cesarean section Active Problems:   Supervision of normal first pregnancy   Abnormal chromosomal and genetic finding on antenatal screening mother   Alpha thalassemia silent carrier   Premature rupture of membranes  Additional problems: none    Discharge diagnosis: Term Pregnancy Delivered and Gestational Hypertension                                              Post partum procedures:no Augmentation: AROM and Pitocin  Complications: None  Hospital course: Induction of Labor With Cesarean Section   18 y.o. yo G1P1001 at [redacted]w[redacted]d was admitted to the hospital 02/17/2024 for induction of labor after PROM and new-onset gHTN. Patient had a labor course significant for fetal intolerance with repetitive prolonged decelerations. Unable to augment past 6 cm. The patient went for cesarean section due to Non-Reassuring FHR. Delivery details are as follows: Membrane Rupture Time/Date: 9:45 PM,02/17/2024  Delivery Method:C-Section, Low Transverse Operative Delivery:N/A Details of operation can be found in separate operative Note.  Patient had a postpartum course complicated by hypertension. She is ambulating, tolerating a regular diet, passing flatus, and urinating well.  Patient is discharged home in stable condition on 02/20/2024       Newborn Data: Birth date:02/18/2024 Birth time:11:19 PM Gender:Female Living status:Living Apgars:8 ,9  Weight:2990 g                               Magnesium Sulfate received: No BMZ received: No Rhophylac:No MMR:No T-DaP:Given prenatally Flu:  N/A RSV Vaccine received: No Transfusion:No  Immunizations received: Immunization History  Administered Date(s) Administered   Influenza, Seasonal, Injecte, Preservative Fre 08/21/2023   Tdap 12/08/2023    Physical exam  Vitals:   02/19/24 1312 02/19/24 1801 02/19/24 1940 02/20/24 0550  BP: (!) 146/81 (!) 134/93 128/70 124/69  Pulse: 81 83 77 79  Resp: 18 18 18 16   Temp: 98.4 F (36.9 C) 98.6 F (37 C) 98.4 F (36.9 C) 98.2 F (36.8 C)  TempSrc: Oral Oral Oral Oral  SpO2:  100%  99%  Weight:      Height:       General: alert, cooperative, and no distress Lochia: appropriate Uterine Fundus: firm Incision: Healing well with no significant drainage DVT Evaluation: No evidence of DVT seen on physical exam. Labs: Lab Results  Component Value Date   WBC 16.1 (H) 02/19/2024   HGB 11.5 (L) 02/19/2024   HCT 33.1 (L) 02/19/2024   MCV 79.2 (L) 02/19/2024   PLT 178 02/19/2024      Latest Ref Rng & Units 02/18/2024   12:58 AM  CMP  Glucose 70 - 99 mg/dL 86   BUN 6 - 20 mg/dL 7   Creatinine 9.55 - 8.99 mg/dL 9.23   Sodium 864 - 854 mmol/L 137   Potassium 3.5 - 5.1 mmol/L 3.8   Chloride 98 - 111 mmol/L 112   CO2 22 - 32 mmol/L 18   Calcium  8.9 - 10.3 mg/dL 8.8   Total Protein 6.5 - 8.1 g/dL 6.3   Total Bilirubin 0.0 - 1.2 mg/dL 0.5   Alkaline Phos 38 - 126 U/L 117   AST 15 - 41 U/L 25   ALT 0 - 44 U/L 18    Edinburgh Score:    02/19/2024    4:10 AM  Edinburgh Postnatal Depression Scale Screening Tool  I have been able to laugh and see the funny side of things. 0  I have looked forward with enjoyment to things. 0  I have blamed myself unnecessarily when things went wrong. 2  I have been anxious or worried for no good reason. 2  I have felt scared or panicky for no good reason. 0  Things have been getting on top of me. 1  I have been so unhappy that I have had difficulty sleeping. 1  I have felt sad or miserable. 0  I have been so unhappy that I have been crying. 0   The thought of harming myself has occurred to me. 0  Edinburgh Postnatal Depression Scale Total 6   Edinburgh Postnatal Depression Scale Total: 6   After visit meds:  Allergies as of 02/20/2024   No Known Allergies      Medication List     STOP taking these medications    aspirin  EC 81 MG tablet   Blood Pressure Monitor Misc   docusate sodium  100 MG capsule Commonly known as: Colace   hydrocortisone  2.5 % rectal cream Commonly known as: ANUSOL -HC   prenatal vitamin w/FE, FA 27-1 MG Tabs tablet   TYLENOL  PO       TAKE these medications    furosemide  40 MG tablet Commonly known as: LASIX  Take 1 tablet (40 mg total) by mouth daily.   ibuprofen  800 MG tablet Commonly known as: ADVIL  Take 1 tablet (800 mg total) by mouth every 8 (eight) hours.   NIFEdipine  30 MG 24 hr tablet Commonly known as: ADALAT  CC Take 1 tablet (30 mg total) by mouth 2 (two) times daily.   oxyCODONE  5 MG immediate release tablet Commonly known as: Oxy IR/ROXICODONE  Take 1-2 tablets (5-10 mg total) by mouth every 4 (four) hours as needed for moderate pain (pain score 4-6).         Discharge home in stable condition Infant Feeding: Bottle and Breast Infant Disposition:home with mother Discharge instruction: per After Visit Summary and Postpartum booklet. Activity: Advance as tolerated. Pelvic rest for 6 weeks.  Diet: routine diet Future Appointments: Future Appointments  Date Time Provider Department Center  02/25/2024  9:50 AM Kizzie Suzen SAUNDERS, CNM [facility]  03/31/2024 10:30 AM Loreli Suzen BIRCH, CNM [facility]   Follow up Visit:  Follow-up Information     [facility] for [facility] Follow up in 1 week(s).   Specialty: Obstetrics and Gynecology Why: For wound re-check Contact information: [address] [place]  [address] [phone]                Message sent to FT 7/3  Please schedule this  patient for a In person postpartum visit in 6 weeks with the following provider: Any provider. Additional Postpartum F/U: Incision check 1 week and BP check 1 week  Low risk pregnancy complicated by: gHTN Delivery mode:  C-Section, Low Transverse Anticipated Birth Control:  Unsure   02/20/2024 Lynwood Solomons, MD

## 2024-02-19 NOTE — Op Note (Signed)
 Cesarean Section Operative Note   Patient: Breanna Soto  Date of Procedure: 02/17/2024 - 02/18/2024  Procedure: Primary Low Transverse Cesarean   Indications: non-reassuring fetal status  Pre-operative Diagnosis: Non-reassuring fetal heart rate, inability to augment given repetitive prolonged decelerations  Post-operative Diagnosis: Same  TOLAC Candidate: Yes   Surgeon: Surgeons and Role:    DEWAINE Fredirick Glenys GORMAN, MD - Primary    * Nicholaus Almarie HERO, MD - Fellow  Assistants: An experienced assistant was required given the standard of surgical care given the complexity of the case.  This assistant was needed for exposure, dissection, suctioning, retraction, instrument exchange, assisting with delivery with administration of fundal pressure, and for overall help during the procedure.   Anesthesia: epidural  Anesthesiologist: Peggye Delon Brunswick, MD   Antibiotics: Cefazolin and Azithromycin   Estimated Blood Loss: 398 ml   Total IV Fluids: 1000 ml  Urine Output: 100 cc OF clear urine  Specimens: None  Complications: no complications   Indications: Breanna Soto is a 18 y.o. G1P0000 with an IUP [redacted]w[redacted]d presenting for unscheduled, urgent cesarean secondary to the indications listed above. Clinical course notable for PROM. Unable to progress past 6 cm 2/2 fetal intolerance with repetitive prolonged decelerations with inadequate contractions.  The risks of cesarean section discussed with the patient included but were not limited to: bleeding which may require transfusion or reoperation; infection which may require antibiotics; injury to bowel, bladder, ureters or other surrounding organs; injury to the fetus; need for additional procedures including hysterectomy in the event of a life-threatening hemorrhage; placental abnormalities with subsequent pregnancies, incisional problems, thromboembolic phenomenon and other postoperative/anesthesia complications. The patient concurred with the  proposed plan, giving informed written consent for the procedure. Patient NPO status waived given urgency of case. Anesthesia and OR aware. Preoperative prophylactic antibiotics and SCDs ordered on call to the OR.   Findings: Viable infant in cephalic presentation (direct OP with significant caput), no nuchal cord present. See APGARS in NICU note. Weight 2990 g. Clear amniotic fluid. Normal placenta, three vessel cord. Normal uterus, Normal bilateral fallopian tubes, Normal bilateral ovaries. No adhesive disease was encountered.  Procedure Details: A Time Out was held and the above information confirmed. The patient received intravenous antibiotics and had sequential compression devices applied to her lower extremities preoperatively. The patient was taken back to the operative suite where epidural anesthesia was dosed to a surgical level. After induction of anesthesia, the patient was draped and prepped in the usual sterile manner and placed in a dorsal supine position with a leftward tilt. A low transverse skin incision was made with scalpel and carried down through the subcutaneous tissue to the fascia. Fascial incision was made and extended transversely. The fascia was separated from the underlying rectus tissue superiorly and inferiorly. The rectus muscles were separated in the midline bluntly and the peritoneum was entered bluntly. An Alexis retractor was placed to aid in visualization of the uterus. A bladder flap was not developed. A low transverse uterine incision was made. The infant was successfully delivered from cephalic presentation, the umbilical cord was clamped after 1 minute. Cord ph was not sent, and cord blood was obtained for evaluation. The placenta was removed Intact and appeared normal. The uterine incision was closed with a single layer running unlocked suture of 0-Monocryl. Developing hematoma right apex of hysterotomy. Figure-of-8 placed with 0-Monocryl for hemostasis. The abdomen and  the pelvis were cleared of all clot and debris and the Thersia was removed. Hemostasis was confirmed on  all surfaces.  The peritoneum was reapproximated using 2-0 vicryl . The fascia was then closed using 0-Vicryl in a running fashion. The subcutaneous layer was not reapproximated (<2 cm). The skin was closed with a 4-0 vicryl subcuticular stitch. Honeycomb and pressure dressing applied. The patient tolerated the procedure well. Sponge, lap, instrument and needle counts were correct x 2. She was taken to the recovery room in stable condition.  Disposition: PACU - hemodynamically stable.    Signed: Almarie CHRISTELLA Moats, MD OB Fellow, [facility] for [facility], [facility]

## 2024-02-19 NOTE — Lactation Note (Signed)
 This note was copied from a baby's chart. Lactation Consultation Note  Patient Name: Breanna Soto Date: 02/19/2024 Age:20 hours Reason for consult: Initial assessment;1st time breastfeeding;Early term 37-38.6wks MOB latched infant on her right breast using the football hold position, infant latched with depth and actively breastfeed for 10 minutes. MOB will continue to breastfeed infant by cues, on demand, 8-12 times within 24 hours, skin to skin. MOB knows to call for further latch assistance if needed. LC discussed infant's input and output. LC discussed the importance of maternal rest, meals and hydration. MOB was made aware of O/P services, breastfeeding support groups, community resources, and our phone # for post-discharge questions.    Maternal Data Has patient been taught Hand Expression?: Yes Does the patient have breastfeeding experience prior to this delivery?: No  Feeding Mother's Current Feeding Choice: Breast Milk and Formula  LATCH Score Latch: Grasps breast easily, tongue down, lips flanged, rhythmical sucking.  Audible Swallowing: Spontaneous and intermittent  Type of Nipple: Everted at rest and after stimulation  Comfort (Breast/Nipple): Soft / non-tender  Hold (Positioning): Assistance needed to correctly position infant at breast and maintain latch.  LATCH Score: 9   Lactation Tools Discussed/Used    Interventions Interventions: Breast feeding basics reviewed;Assisted with latch;Skin to skin;Breast compression;Adjust position;Support pillows;Position options;DEBP;Education;LC Services brochure  Discharge Pump: DEBP;Personal  Consult Status Consult Status: Follow-up Date: 02/19/24 Follow-up type: In-patient    Grayce LULLA Batter 02/19/2024, 2:48 AM

## 2024-02-19 NOTE — Plan of Care (Signed)
  Problem: Education: Goal: Knowledge of General Education information will improve Description: Including pain rating scale, medication(s)/side effects and non-pharmacologic comfort measures Outcome: Progressing   Problem: Health Behavior/Discharge Planning: Goal: Ability to manage health-related needs will improve Outcome: Progressing   Problem: Clinical Measurements: Goal: Ability to maintain clinical measurements within normal limits will improve Outcome: Progressing Goal: Will remain free from infection Outcome: Progressing Goal: Diagnostic test results will improve Outcome: Progressing Goal: Respiratory complications will improve Outcome: Progressing Goal: Cardiovascular complication will be avoided Outcome: Progressing   Problem: Activity: Goal: Risk for activity intolerance will decrease Outcome: Progressing   Problem: Nutrition: Goal: Adequate nutrition will be maintained Outcome: Progressing   Problem: Coping: Goal: Level of anxiety will decrease Outcome: Progressing   Problem: Elimination: Goal: Will not experience complications related to bowel motility Outcome: Progressing Goal: Will not experience complications related to urinary retention Outcome: Progressing   Problem: Pain Managment: Goal: General experience of comfort will improve and/or be controlled Outcome: Progressing   Problem: Safety: Goal: Ability to remain free from injury will improve Outcome: Progressing   Problem: Skin Integrity: Goal: Risk for impaired skin integrity will decrease Outcome: Progressing   Problem: Education: Goal: Knowledge of the prescribed therapeutic regimen will improve Outcome: Progressing Goal: Understanding of sexual limitations or changes related to disease process or condition will improve Outcome: Progressing Goal: Individualized Educational Video(s) Outcome: Progressing   Problem: Self-Concept: Goal: Communication of feelings regarding changes in body  function or appearance will improve Outcome: Progressing   Problem: Skin Integrity: Goal: Demonstration of wound healing without infection will improve Outcome: Progressing   Problem: Education: Goal: Knowledge of condition will improve Outcome: Progressing Goal: Individualized Educational Video(s) Outcome: Progressing Goal: Individualized Newborn Educational Video(s) Outcome: Progressing   Problem: Activity: Goal: Will verbalize the importance of balancing activity with adequate rest periods Outcome: Progressing Goal: Ability to tolerate increased activity will improve Outcome: Progressing   Problem: Coping: Goal: Ability to identify and utilize available resources and services will improve Outcome: Progressing   Problem: Life Cycle: Goal: Chance of risk for complications during the postpartum period will decrease Outcome: Progressing   Problem: Role Relationship: Goal: Ability to demonstrate positive interaction with newborn will improve Outcome: Progressing   Problem: Skin Integrity: Goal: Demonstration of wound healing without infection will improve Outcome: Progressing   Problem: Education: Goal: Knowledge of Childbirth will improve Outcome: Completed/Met Goal: Ability to make informed decisions regarding treatment and plan of care will improve Outcome: Completed/Met Goal: Ability to state and carry out methods to decrease the pain will improve Outcome: Completed/Met Goal: Individualized Educational Video(s) Outcome: Completed/Met   Problem: Coping: Goal: Ability to verbalize concerns and feelings about labor and delivery will improve Outcome: Completed/Met   Problem: Life Cycle: Goal: Ability to make normal progression through stages of labor will improve Outcome: Completed/Met Goal: Ability to effectively push during vaginal delivery will improve Outcome: Completed/Met   Problem: Role Relationship: Goal: Will demonstrate positive interactions with the  child Outcome: Completed/Met   Problem: Safety: Goal: Risk of complications during labor and delivery will decrease Outcome: Completed/Met   Problem: Pain Management: Goal: Relief or control of pain from uterine contractions will improve Outcome: Completed/Met

## 2024-02-19 NOTE — Progress Notes (Signed)
 Notified by RN of persistently mild range BPs. Increase procardia to 30 mg BID  Rollo ONEIDA Bring, MD, FACOG Obstetrician & Gynecologist, [facility] for [facility], [facility]

## 2024-02-19 NOTE — Patient Instructions (Signed)
 Your appointment with Outpatient Lactation is: Date: 03/04/2024 Time: 3:30 PM [facility] (First Floor) [address] [place]  Check in under baby's name.  Please bring your baby hungry along with your pump and a bottle of either formula or expressed breast milk. Please also bring your pump flanges and we welcome support people! If you need lactation assistance before your appointment, please call [phone] and press 4 for lactation.

## 2024-02-20 MED ORDER — FUROSEMIDE 40 MG PO TABS: 40.0000 mg | ORAL_TABLET | Freq: Every day | ORAL | 0 refills | Status: DC

## 2024-02-20 MED ORDER — OXYCODONE HCL 5 MG PO TABS: 5.0000 mg | ORAL_TABLET | ORAL | 0 refills | Status: DC | PRN

## 2024-02-20 MED ORDER — NIFEDIPINE ER 30 MG PO TB24
30.0000 mg | ORAL_TABLET | Freq: Two times a day (BID) | ORAL | 1 refills | Status: DC
  Filled 2024-02-20: qty 60, 30d supply, fill #0

## 2024-02-20 MED ORDER — FUROSEMIDE 40 MG PO TABS
40.0000 mg | ORAL_TABLET | Freq: Every day | ORAL | 0 refills | Status: DC
  Filled 2024-02-20: qty 4, 4d supply, fill #0

## 2024-02-20 MED ORDER — OXYCODONE HCL 5 MG PO TABS
5.0000 mg | ORAL_TABLET | ORAL | 0 refills | Status: DC | PRN
  Filled 2024-02-20: qty 30, 3d supply, fill #0

## 2024-02-20 MED ORDER — IBUPROFEN 800 MG PO TABS: 800.0000 mg | ORAL_TABLET | Freq: Three times a day (TID) | ORAL | 0 refills | Status: DC

## 2024-02-20 MED ORDER — NIFEDIPINE ER 30 MG PO TB24: 30.0000 mg | ORAL_TABLET | Freq: Two times a day (BID) | ORAL | 1 refills | Status: DC

## 2024-02-20 MED ORDER — IBUPROFEN 800 MG PO TABS
800.0000 mg | ORAL_TABLET | Freq: Three times a day (TID) | ORAL | 0 refills | Status: DC
  Filled 2024-02-20: qty 30, 10d supply, fill #0

## 2024-02-20 NOTE — Lactation Note (Signed)
 This note was copied from a baby's chart. Lactation Consultation Note  Patient Name: Breanna Soto Date: 02/20/2024 Age:20 hours Reason for consult: Follow-up assessment;Infant weight loss;Primapara;1st time breastfeeding;Early term 37-38.6wks (4 % weight loss) Mom confirmed the last time the baby was at the breast was yesterday at 1145 am for 20 mins, otherwise the baby has been fed bottles.  Per mom wants to breast feed. LC reviewed supply and demand, importance of giving the baby practice at the breast for latching. Per mom the baby latches well when he latches. Mom denies sore nipples.  LC reviewed breast feeding goals of 24  hours to establish the milk and the importance of the 1st 2 weeks of breast feeding.  LC reviewed engorgement prevention and tx .  LC provided a hand pump and flange check - see below.  Mom aware of the [facility] resources.   Maternal Data Has patient been taught Hand Expression?: Yes Does the patient have breastfeeding experience prior to this delivery?: No  Feeding Mother's Current Feeding Choice: Breast Milk and Formula Nipple Type: Slow - flow  LATCH Score -9   LC unable to assess breast feeding at this consult due to baby recently being fed formula.   Lactation Tools Discussed/Used Tools: Pump;Flanges Flange Size: 18;21 Breast pump type: Manual Pump Education: Milk Storage;Setup, frequency, and cleaning Reason for Pumping: PRN  Interventions Interventions: Breast feeding basics reviewed;Hand pump;Education;LC Services brochure;CDC milk storage guidelines;CDC Guidelines for Breast Pump Cleaning  Discharge Discharge Education: Engorgement and breast care;Warning signs for feeding baby Pump: Personal;DEBP;Manual  Consult Status Consult Status: Complete Date: 02/20/24    [name] 02/20/2024, 11:41 AM

## 2024-02-21 ENCOUNTER — Other Ambulatory Visit (HOSPITAL_COMMUNITY): Payer: Self-pay

## 2024-02-23 ENCOUNTER — Encounter: Admitting: Advanced Practice Midwife

## 2024-02-25 ENCOUNTER — Ambulatory Visit: Admitting: Women's Health

## 2024-02-25 ENCOUNTER — Encounter: Payer: Self-pay | Admitting: Women's Health

## 2024-02-25 ENCOUNTER — Telehealth

## 2024-02-25 ENCOUNTER — Encounter: Payer: Self-pay | Admitting: *Deleted

## 2024-02-25 VITALS — BP 144/92 | HR 76 | Ht 66.0 in | Wt 199.2 lb

## 2024-02-25 DIAGNOSIS — Z8759 Personal history of other complications of pregnancy, childbirth and the puerperium: Secondary | ICD-10-CM | POA: Insufficient documentation

## 2024-02-25 DIAGNOSIS — Z4889 Encounter for other specified surgical aftercare: Secondary | ICD-10-CM

## 2024-02-25 DIAGNOSIS — O165 Unspecified maternal hypertension, complicating the puerperium: Secondary | ICD-10-CM | POA: Insufficient documentation

## 2024-02-25 MED ORDER — NIFEDIPINE ER 60 MG PO TB24: 60.0000 mg | ORAL_TABLET | Freq: Two times a day (BID) | ORAL | 1 refills | Status: DC

## 2024-02-25 NOTE — Progress Notes (Addendum)
 GYN VISIT Patient name: Breanna Soto MRN [id number]  Date of birth: Feb 10, 2006 Chief Complaint:   Post-op Follow-up (1 wk incision check, bpcheck)  History of Present Illness:   Breanna Soto is a 18 y.o. G53P1001 African-American female 6d s/p PCS being seen today for bp and incision check.  GHTN on admit, d/c'd on lasix  x 5d and nifedipine  30mg  BID- hasn't taken yet this am. Denies ha, visual changes, ruq/epigastric pain, n/v.  Breast & formula feeding, supply is great. Thinking about pills for birth control. Denies ppd/anxiety.  Patient's last menstrual period was 05/25/2023 (exact date).     12/08/2023    9:09 AM 08/21/2023   10:41 AM 05/07/2023    9:33 AM  Depression screen PHQ 2/9  Decreased Interest 0 1 0  Down, Depressed, Hopeless 0 0 0  PHQ - 2 Score 0 1 0  Altered sleeping 1 2 3   Tired, decreased energy 1 2 3   Change in appetite 0 2 2  Feeling bad or failure about yourself  0 0 0  Trouble concentrating 0 0 0  Moving slowly or fidgety/restless 0 0 0  Suicidal thoughts 0 0 0  PHQ-9 Score 2 7 8         12/08/2023    9:11 AM 08/21/2023   10:42 AM 05/07/2023    9:34 AM  GAD 7 : Generalized Anxiety Score  Nervous, Anxious, on Edge 0 2 3  Control/stop worrying 0 2 3  Worry too much - different things 1 2 3   Trouble relaxing 1 1 3   Restless 0 0 1  Easily annoyed or irritable 1 1 0  Afraid - awful might happen 0 3 0  Total GAD 7 Score 3 11 13      Review of Systems:   Pertinent items are noted in HPI Denies fever/chills, dizziness, headaches, visual disturbances, fatigue, shortness of breath, chest pain, abdominal pain, vomiting, abnormal vaginal discharge/itching/odor/irritation, problems with periods, bowel movements, urination, or intercourse unless otherwise stated above.  Pertinent History Reviewed:  Reviewed past medical,surgical, social, obstetrical and family history.  Reviewed problem list, medications and allergies. Physical Assessment:   Vitals:   02/25/24  0954 02/25/24 1012  BP: (!) 141/94 (!) 144/92  Pulse: 82 76  Weight: 199 lb 3.2 oz (90.4 kg)   Height: 5' 6 (1.676 m)   Body mass index is 32.15 kg/m.       Physical Examination:   General appearance: alert, well appearing, and in no distress  Mental status: alert, oriented to person, place, and time  Skin: warm & dry   Cardiovascular: normal heart rate noted  Respiratory: normal respiratory effort, no distress  Abdomen: soft, non-tender, c/s incsion healing well, no s/s infection  Pelvic: examination not indicated  Extremities: no edema   Chaperone: N/A  No results found for this or any previous visit (from the past 24 hours).  Assessment & Plan:  1) 6d s/p PCS> breast & formula feeding  2) Incision check> healing well, no s/s infection. 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   No orders of the defined types were placed in this encounter.   Return for this afternoon for online nurse bp check please.  Suzen JONELLE Fetters CNM, [facility] 02/25/2024 10:46 AM   BP 149/92  at home per pt, increase nifedipine  to 60mg  BID, new rx sent. F/U Fri or Mon for bp check w/ nurse- take meds at least 2hr before visit.  Suzen FABIENE Fetters, CNM, [facility] 02/25/2024 3:31 PM

## 2024-02-25 NOTE — Addendum Note (Signed)
 Addended by: KIZZIE SUZEN SAUNDERS on: 02/25/2024 03:31 PM   Modules accepted: Orders

## 2024-02-27 ENCOUNTER — Ambulatory Visit: Admitting: *Deleted

## 2024-02-27 ENCOUNTER — Encounter: Payer: Self-pay | Admitting: *Deleted

## 2024-02-27 VITALS — BP 120/83 | HR 97 | Ht 66.0 in | Wt 197.0 lb

## 2024-02-27 DIAGNOSIS — Z0130 Encounter for examination of blood pressure without abnormal findings: Secondary | ICD-10-CM

## 2024-02-27 NOTE — Progress Notes (Signed)
   NURSE VISIT- BLOOD PRESSURE CHECK  SUBJECTIVE:  Breanna Soto is a 18 y.o. G38P1001 female here for BP check. She is postpartum, delivery date 02/18/24    HYPERTENSION ROS:  Pregnant/postpartum:  Severe headaches that don't go away with tylenol /other medicines: Yes  Visual changes (seeing spots/double/blurred vision) No  Severe pain under right breast breast or in center of upper chest No  Severe nausea/vomiting No  Taking medicines as instructed yes    OBJECTIVE:  BP 120/83 (BP Location: Left Arm, Patient Position: Sitting, Cuff Size: Normal)   Pulse 97   Ht 5' 6 (1.676 m)   Wt 197 lb (89.4 kg)   LMP 05/25/2023 (Exact Date)   Breastfeeding Yes   BMI 31.80 kg/m   Appearance alert, well appearing, and in no distress.  ASSESSMENT: Postpartum  blood pressure check  PLAN: Discussed with Dr. Jayne   Recommendations: decrease Nifedipine  to 30 mg daily; stop med 2 days before pp visit   Follow-up: as scheduled for pp visit.   Breanna Soto  02/27/2024 10:40 AM

## 2024-02-29 ENCOUNTER — Inpatient Hospital Stay (HOSPITAL_COMMUNITY): Admit: 2024-02-29

## 2024-03-01 ENCOUNTER — Other Ambulatory Visit

## 2024-03-01 ENCOUNTER — Encounter: Admitting: Women's Health

## 2024-03-31 ENCOUNTER — Encounter: Payer: Self-pay | Admitting: Advanced Practice Midwife

## 2024-03-31 ENCOUNTER — Ambulatory Visit: Admitting: Advanced Practice Midwife

## 2024-03-31 MED ORDER — NORETHIN ACE-ETH ESTRAD-FE 1-20 MG-MCG PO TABS: 1.0000 | ORAL_TABLET | Freq: Every day | ORAL | 11 refills | Status: DC

## 2024-03-31 NOTE — Patient Instructions (Signed)
Use the website www.postpartum.net for helpful postpartum resources!

## 2024-03-31 NOTE — Progress Notes (Signed)
 POSTPARTUM VISIT Patient name: Breanna Soto MRN [id number]  Date of birth: 2006/06/05 Chief Complaint:   Postpartum Care  History of Present Illness:   Breanna Soto is a 18 y.o. G31P1001 African American female being seen today for a postpartum visit. She is 6 weeks postpartum following a primary cesarean section, low transverse incision at 38.4 gestational weeks for [facility]. IOL: yes, for PROM. Anesthesia: epidural.  Laceration: n/a.  Complications: dx with gHTN upon admission (neg labs; asymptomatic). Inpatient contraception: no.   Pregnancy uncomplicated. Tobacco use: no. Substance use disorder: no. Last pap smear: <21yo and results were N/A. Next pap smear due: Feb 2026 Patient's last menstrual period was 05/25/2023 (exact date).  Postpartum course has been complicated by requiring ProcardiaXL 30mg  and Lasix . Bleeding none. Bowel function is normal. Bladder function is normal. Urinary incontinence? no, fecal incontinence? no Patient is sexually active. Last sexual activity: yesterday. Desired contraception: COCs. Patient does want a pregnancy in the future.  Desired family size is unsure number of children.   Upstream - 03/31/24 1045       Pregnancy Intention Screening   Does the patient want to become pregnant in the next year? No    Does the patient's partner want to become pregnant in the next year? No    Would the patient like to discuss contraceptive options today? No      Contraception Wrap Up   Current Method Abstinence    End Method Oral Contraceptive    Contraception Counseling Provided No         The pregnancy intention screening data noted above was reviewed. Potential methods of contraception were discussed. The patient elected to proceed with Oral Contraceptive.  Edinburgh Postpartum Depression Screening: negative  Edinburgh Postnatal Depression Scale - 03/31/24 1047       Edinburgh Postnatal Depression Scale:  In the Past 7 Days   I have been able to laugh and  see the funny side of things. 0    I have looked forward with enjoyment to things. 0    I have blamed myself unnecessarily when things went wrong. 2    I have been anxious or worried for no good reason. 0    I have felt scared or panicky for no good reason. 0    Things have been getting on top of me. 1    I have been so unhappy that I have had difficulty sleeping. 1    I have felt sad or miserable. 0    I have been so unhappy that I have been crying. 0    The thought of harming myself has occurred to me. 0    Edinburgh Postnatal Depression Scale Total 4             12/08/2023    9:11 AM 08/21/2023   10:42 AM 05/07/2023    9:34 AM  GAD 7 : Generalized Anxiety Score  Nervous, Anxious, on Edge 0 2 3  Control/stop worrying 0 2 3  Worry too much - different things 1 2 3   Trouble relaxing 1 1 3   Restless 0 0 1  Easily annoyed or irritable 1 1 0  Afraid - awful might happen 0 3 0  Total GAD 7 Score 3 11 13      Baby's course has been uncomplicated. Baby is feeding by bottle. Infant has a pediatrician/family doctor? Yes.  Childcare strategy if returning to work/school: family.  Pt has material needs met for her and baby: Yes.  Review of Systems:   Pertinent items are noted in HPI Denies Abnormal vaginal discharge w/ itching/odor/irritation, headaches, visual changes, shortness of breath, chest pain, abdominal pain, severe nausea/vomiting, or problems with urination or bowel movements. Pertinent History Reviewed:  Reviewed past medical,surgical, obstetrical and family history.  Reviewed problem list, medications and allergies. OB History  Gravida Para Term Preterm AB Living  1 1 1  0 0 1  SAB IAB Ectopic Multiple Live Births  0 0 0 0 1    # Outcome Date GA Lbr Len/2nd Weight Sex Type Anes PTL Lv  1 Term 02/18/24 [redacted]w[redacted]d  6 lb 9.5 oz (2.99 kg) M CS-LTranv EPI  LIV   Physical Assessment:   Vitals:   03/31/24 1046  BP: 115/75  Pulse: 80  Weight: 204 lb (92.5 kg)  Height: 5' 6  (1.676 m)  Body mass index is 32.93 kg/m.       Physical Examination:   General appearance: alert, well appearing, and in no distress  Mental status: alert, oriented to person, place, and time  Skin: warm & dry   Cardiovascular: normal heart rate noted   Respiratory: normal respiratory effort, no distress   Breasts: deferred, no complaints   Abdomen: soft, non-tender; well healed LTCS incision  Pelvic: examination not indicated. Thin prep pap obtained: No  Rectal: not examined  Extremities: Edema: none        No results found for this or any previous visit (from the past 24 hours).  Assessment & Plan:  1) Postpartum exam 2) Six wks s/p primary cesarean section, low transverse incision for NRFHR 3) bottle feeding 4) Depression screening> essentially neg; feels well 5) Contraception management: discussed abstinence x 2wks with return to UPT 8/26; if neg to start OCPs (sent in today) with condoms during first pack and thereafter for STI protection 6) Hx gHTN, BP stable without meds  Essential components of care per ACOG recommendations:  1.  Mood and well being:  If positive depression screen, discussed and plan developed.  If using tobacco we discussed reduction/cessation and risk of relapse If current substance abuse, we discussed and referral to local resources was offered.   2. Infant care and feeding:  If breastfeeding, discussed returning to work, pumping, breastfeeding-associated pain, guidance regarding return to fertility while lactating if not using another method. If needed, patient was provided with a letter to be allowed to pump q 2-3hrs to support lactation in a private location with access to a refrigerator to store breastmilk.   Recommended that all caregivers be immunized for flu, pertussis and other preventable communicable diseases If pt does not have material needs met for her/baby, referred to local resources for help obtaining these.  3. Sexuality,  contraception and birth spacing Provided guidance regarding sexuality, management of dyspareunia, and resumption of intercourse Discussed avoiding interpregnancy interval <4mths and recommended birth spacing of 18 months  4. Sleep and fatigue Discussed coping options for fatigue and sleep disruption Encouraged family/partner/community support of 4 hrs of uninterrupted sleep to help with mood and fatigue  5. Physical recovery  If pt had a C/S, assessed incisional pain and providing guidance on normal vs prolonged recovery If pt had a laceration, perineal healing and pain reviewed.  If urinary or fecal incontinence, discussed management and referred to PT or uro/gyn if indicated  Patient is safe to resume physical activity. Discussed attainment of healthy weight.  6.  Chronic disease management Discussed pregnancy complications if any, and their implications for future childbearing and  long-term maternal health. Review recommendations for prevention of recurrent pregnancy complications, such as 17 hydroxyprogesterone caproate to reduce risk for recurrent PTB not applicable, or aspirin  to reduce risk of preeclampsia yes. Pt had GDM: no. If yes, 2hr GTT scheduled: not applicable. Reviewed medications and non-pregnant dosing including consideration of whether pt is breastfeeding using a reliable resource such as LactMed: not applicable Referred for f/u w/ PCP or subspecialist providers as indicated: goes to [facility] for primary care  7. Health maintenance Mammogram at 20yo or earlier if indicated Pap smears as indicated  Meds:  Meds ordered this encounter  Medications   norethindrone-ethinyl estradiol-FE (JUNEL FE 1/20) 1-20 MG-MCG tablet    Sig: Take 1 tablet by mouth daily.    Dispense:  28 tablet    Refill:  11    Supervising Provider:   MARILYNN NEST [[id number]]    Follow-up: Return for RN UPT on 8/26; needs Pap/physical Feb 2026.   No orders of the defined types were placed in this  encounter.   Suzen JONETTA Gentry CNM 03/31/2024 11:04 AM

## 2024-04-12 ENCOUNTER — Ambulatory Visit: Admitting: *Deleted

## 2024-04-12 DIAGNOSIS — Z3202 Encounter for pregnancy test, result negative: Secondary | ICD-10-CM

## 2024-04-12 LAB — POCT URINE PREGNANCY: Preg Test, Ur: NEGATIVE

## 2024-04-12 NOTE — Progress Notes (Signed)
 Patient in for a pregnancy test before starting birth control. Preg test negative. Patient already has prescription.

## 2024-08-17 ENCOUNTER — Ambulatory Visit (INDEPENDENT_AMBULATORY_CARE_PROVIDER_SITE_OTHER): Admitting: *Deleted

## 2024-08-17 ENCOUNTER — Other Ambulatory Visit: Payer: Self-pay | Admitting: Adult Health

## 2024-08-17 VITALS — BP 133/80 | HR 80 | Ht 66.0 in | Wt 202.0 lb

## 2024-08-17 DIAGNOSIS — N926 Irregular menstruation, unspecified: Secondary | ICD-10-CM

## 2024-08-17 DIAGNOSIS — Z3201 Encounter for pregnancy test, result positive: Secondary | ICD-10-CM

## 2024-08-17 LAB — POCT URINE PREGNANCY: Preg Test, Ur: POSITIVE — AB

## 2024-08-17 NOTE — Progress Notes (Signed)
" ° °  NURSE VISIT- PREGNANCY CONFIRMATION   SUBJECTIVE:  Breanna Soto is a 18 y.o. G77P1001 female at [redacted]w[redacted]d by certain LMP of Patient's last menstrual period was 07/13/2024. Here for pregnancy confirmation.  Home pregnancy test: positive x 2  She reports cramping.  She is not taking prenatal vitamins.    OBJECTIVE:  BP 133/80 (BP Location: Right Arm, Patient Position: Sitting, Cuff Size: Normal)   Pulse 80   Ht 5' 6 (1.676 m)   Wt 202 lb (91.6 kg)   LMP 07/13/2024   Breastfeeding No   BMI 32.60 kg/m   Appears well, in no apparent distress  Results for orders placed or performed in visit on 08/17/24 (from the past 24 hours)  POCT urine pregnancy   Collection Time: 08/17/24 11:22 AM  Result Value Ref Range   Preg Test, Ur Positive (A) Negative    ASSESSMENT: Positive pregnancy test, [redacted]w[redacted]d by LMP    PLAN: Schedule for dating ultrasound in 3 weeks Prenatal vitamins: plans to begin OTC ASAP   Nausea medicines: not currently needed   OB packet given: Yes  Kazi Montoro  08/17/2024 11:25 AM  "

## 2024-09-06 ENCOUNTER — Other Ambulatory Visit: Payer: Self-pay | Admitting: Obstetrics & Gynecology

## 2024-09-06 DIAGNOSIS — O3680X0 Pregnancy with inconclusive fetal viability, not applicable or unspecified: Secondary | ICD-10-CM

## 2024-09-07 ENCOUNTER — Other Ambulatory Visit

## 2024-09-07 DIAGNOSIS — O3680X0 Pregnancy with inconclusive fetal viability, not applicable or unspecified: Secondary | ICD-10-CM

## 2024-09-07 NOTE — Progress Notes (Signed)
 US  8 wks,single IUP with yolk sac,FHR 156 bpm,CRL 15.96 mm

## 2024-09-23 ENCOUNTER — Encounter: Payer: Self-pay | Admitting: Advanced Practice Midwife

## 2024-09-23 ENCOUNTER — Ambulatory Visit: Admitting: *Deleted

## 2024-09-23 ENCOUNTER — Ambulatory Visit: Admitting: Advanced Practice Midwife

## 2024-09-23 VITALS — BP 129/77 | HR 96 | Wt 192.0 lb

## 2024-09-23 DIAGNOSIS — O09899 Supervision of other high risk pregnancies, unspecified trimester: Secondary | ICD-10-CM | POA: Insufficient documentation

## 2024-09-23 DIAGNOSIS — Z131 Encounter for screening for diabetes mellitus: Secondary | ICD-10-CM

## 2024-09-23 DIAGNOSIS — Z3481 Encounter for supervision of other normal pregnancy, first trimester: Secondary | ICD-10-CM

## 2024-09-23 DIAGNOSIS — O210 Mild hyperemesis gravidarum: Secondary | ICD-10-CM

## 2024-09-23 DIAGNOSIS — Z3A10 10 weeks gestation of pregnancy: Secondary | ICD-10-CM

## 2024-09-23 DIAGNOSIS — Z3480 Encounter for supervision of other normal pregnancy, unspecified trimester: Secondary | ICD-10-CM | POA: Insufficient documentation

## 2024-09-23 DIAGNOSIS — Z363 Encounter for antenatal screening for malformations: Secondary | ICD-10-CM

## 2024-09-23 DIAGNOSIS — Z113 Encounter for screening for infections with a predominantly sexual mode of transmission: Secondary | ICD-10-CM

## 2024-09-23 MED ORDER — ASPIRIN 81 MG PO TBEC: 81.0000 mg | DELAYED_RELEASE_TABLET | Freq: Every day | ORAL | 6 refills | Status: AC

## 2024-09-23 MED ORDER — PROMETHAZINE HCL 12.5 MG RE SUPP: 12.5000 mg | Freq: Four times a day (QID) | RECTAL | 3 refills | Status: AC | PRN

## 2024-09-23 MED ORDER — DOXYLAMINE-PYRIDOXINE 10-10 MG PO TBEC: DELAYED_RELEASE_TABLET | ORAL | 6 refills | Status: AC

## 2024-09-23 NOTE — Progress Notes (Signed)
 "  INITIAL OBSTETRICAL VISIT Patient name: Breanna Soto MRN [id number]  Date of birth: Dec 27, 2005 Chief Complaint:   Initial Prenatal Visit  History of Present Illness:   Breanna Soto is a 19 y.o. G43P1001  female at [redacted]w[redacted]d by US  at 8 weeks with an Estimated Date of Delivery: 04/19/25 being seen today for her initial obstetrical visit.   Her obstetrical history is significant for CS for NRFHT at 6cms.  Had GHTN Today she reports nausea.     09/23/2024   11:57 AM 12/08/2023    9:09 AM 08/21/2023   10:41 AM 05/07/2023    9:33 AM  Depression screen PHQ 2/9  Decreased Interest 1 0 1 0  Down, Depressed, Hopeless 0 0 0 0  PHQ - 2 Score 1 0 1 0  Altered sleeping 3 1 2 3   Tired, decreased energy 3 1 2 3   Change in appetite 1 0 2 2  Feeling bad or failure about yourself  1 0 0 0  Trouble concentrating 1 0 0 0  Moving slowly or fidgety/restless 1 0 0 0  Suicidal thoughts 0 0 0 0  PHQ-9 Score 11 2  7  8       Data saved with a previous flowsheet row definition    Patient's last menstrual period was 07/13/2024. Last pap No results found for: DIAGPAP Review of Systems:   Pertinent items are noted in HPI Denies cramping/contractions, leakage of fluid, vaginal bleeding, abnormal vaginal discharge w/ itching/odor/irritation, headaches, visual changes, shortness of breath, chest pain, abdominal pain, severe nausea/vomiting, or problems with urination or bowel movements unless otherwise stated above.  Pertinent History Reviewed:  Reviewed past medical,surgical, social, obstetrical and family history.  Reviewed problem list, medications and allergies. OB History  Gravida Para Term Preterm AB Living  2 1 1  0 0 1  SAB IAB Ectopic Multiple Live Births  0 0 0 0 1    # Outcome Date GA Lbr Len/2nd Weight Sex Type Anes PTL Lv  2 Current           1 Term 02/18/24 [redacted]w[redacted]d  6 lb 9.5 oz (2.99 kg) M CS-LTranv EPI  LIV     Complications: Gestational hypertension   Physical Assessment:   Vitals:    09/23/24 1139  BP: 129/77  Pulse: 96  Weight: 192 lb (87.1 kg)  Body mass index is 30.99 kg/m.       Physical Examination:  General appearance - well appearing, and in no distress  Mental status - alert, oriented to person, place, and time  Psych:  She has a normal mood and affect  Skin - warm and dry, normal color, no suspicious lesions noted  Chest - effort normal  Heart - normal rate and regular rhythm  Abdomen - soft, nontender  Extremities:  No swelling or varicosities noted  Pelvic - VULVA: normal appearing vulva with no masses, tenderness or lesions  VAGINA: normal appearing vagina with normal color and discharge, no lesions  CERVIX: normal appearing cervix without discharge or lesions, no CMT  Thin prep pap is done w/o HR HPV cotesting  TODAY'S BSUS;  active fetus  No results found for this or any previous visit (from the past 24 hours).   Indications for ASA therapy (per uptodate) One of the following: Previous pregnancy with preeclampsia, especially early onset and with an adverse outcome Yes        09/23/2024   11:57 AM 12/08/2023    9:09 AM 08/21/2023  10:41 AM 05/07/2023    9:33 AM  Depression screen PHQ 2/9  Decreased Interest 1 0 1 0  Down, Depressed, Hopeless 0 0 0 0  PHQ - 2 Score 1 0 1 0  Altered sleeping 3 1 2 3   Tired, decreased energy 3 1 2 3   Change in appetite 1 0 2 2  Feeling bad or failure about yourself  1 0 0 0  Trouble concentrating 1 0 0 0  Moving slowly or fidgety/restless 1 0 0 0  Suicidal thoughts 0 0 0 0  PHQ-9 Score 11 2  7  8       Data saved with a previous flowsheet row definition        09/23/2024   11:57 AM 12/08/2023    9:11 AM 08/21/2023   10:42 AM 05/07/2023    9:34 AM  GAD 7 : Generalized Anxiety Score  Nervous, Anxious, on Edge 1 0  2  3   Control/stop worrying 1 0  2  3   Worry too much - different things 1 1  2  3    Trouble relaxing 1 1  1  3    Restless 1 0  0  1   Easily annoyed or irritable 2 1  1   0   Afraid - awful  might happen 0 0  3  0   Total GAD 7 Score 7 3 11 13      Data saved with a previous flowsheet row definition      Mental Health score normal No Follow up: declines referral, feels that all of her + answers are pregnancy symptoms   Assessment & Plan:  1) Low-Risk Pregnancy G2P1001 at [redacted]w[redacted]d with an Estimated Date of Delivery: 04/19/25   2) Initial OB visit    1. Supervision of other normal pregnancy, antepartum (Primary) Start ASA d/t hx GHTN/BMI 30     2.  Nausea:  most meds weren't helpful w/last pg, the taste of the pills also makes her sick . Will try:   Meds:  Meds ordered this encounter  Medications   aspirin  EC 81 MG tablet    Sig: Take 1 tablet (81 mg total) by mouth daily.    Dispense:  30 tablet    Refill:  6    Supervising Provider:   JAYNE MINDER H [[id number]]   Doxylamine -Pyridoxine  (DICLEGIS ) 10-10 MG TBEC    Sig: Take 2 qhs; may also take one in am and one in afternoon prn nausea    Dispense:  120 tablet    Refill:  6    Supervising Provider:   JAYNE MINDER DEL [[id number]]   promethazine  (PHENERGAN ) 12.5 MG suppository    Sig: Place 1 suppository (12.5 mg total) rectally every 6 (six) hours as needed for nausea or vomiting.    Dispense:  12 each    Refill:  3    Supervising Provider:   JAYNE MINDER DEL [[id number]]    Initial labs obtained Continue prenatal vitamins Reviewed n/v relief measures and warning s/s to report Reviewed recommended weight gain based on pre-gravid BMI Encouraged well-balanced diet Genetic & carrier screening discussed: requests Panorama and AFP,   Ultrasound discussed; fetal survey: requested CCNC completed> form faxed if has or is planning to apply for medicaid The nature of [facility] [employer] with multiple MDs and other Advanced Practice Providers was explained to patient; also emphasized that fellows, residents, and students are part of our team. Has a home bp cuff.SABRA  Check bp weekly, let us  know if >140/90.         Cathlean Cresenzo-Dishmon 12:32 PM  "

## 2024-09-23 NOTE — Patient Instructions (Signed)
 [facility], I greatly value your feedback.  If you receive a survey following your visit with us  today, we appreciate you taking the time to fill it out.  Thanks, Sherrell Ely, DNP, CNM  [facility] [address] [place], [place] [address] Entrance C located off of [address] Free 24/7 valet parking   Nausea & Vomiting Have saltine crackers or pretzels by your bed and eat a few bites before you raise your head out of bed in the morning Eat small frequent meals throughout the day instead of large meals Drink plenty of fluids throughout the day to stay hydrated, just don't drink a lot of fluids with your meals.  This can make your stomach fill up faster making you feel sick Do not brush your teeth right after you eat Products with real ginger are good for nausea, like ginger ale and ginger hard candy Make sure it says made with real ginger! Sucking on sour candy like lemon heads is also good for nausea If your prenatal vitamins make you nauseated, take them at night so you will sleep through the nausea Sea Bands If you feel like you need medicine for the nausea & vomiting please let us  know If you are unable to keep any fluids or food down please let us  know   Constipation Drink plenty of fluid, preferably water , throughout the day Eat foods high in fiber such as fruits, vegetables, and grains Exercise, such as walking, is a good way to keep your bowels regular Drink warm fluids, especially warm prune juice, or decaf coffee Eat a 1/2 cup of real oatmeal (not instant), 1/2 cup applesauce, and 1/2-1 cup warm prune juice every day If needed, you may take Colace (docusate sodium ) stool softener once or twice a day to help keep the stool soft.  If you still are having problems with constipation, you may take Miralax once daily as needed to help keep your bowels regular.   Home Blood Pressure Monitoring for Patients   Your provider has recommended  that you check your blood pressure (BP) at least once a week at home. If you do not have a blood pressure cuff at home, one will be provided for you. Contact your provider if you have not received your monitor within 1 week.   Helpful Tips for Accurate Home Blood Pressure Checks  Don't smoke, exercise, or drink caffeine 30 minutes before checking your BP Use the restroom before checking your BP (a full bladder can raise your pressure) Relax in a comfortable upright chair Feet on the ground Left arm resting comfortably on a flat surface at the level of your heart Legs uncrossed Back supported Sit quietly and don't talk Place the cuff on your bare arm Adjust snuggly, so that only two fingertips can fit between your skin and the top of the cuff Check 2 readings separated by at least one minute Keep a log of your BP readings For a visual, please reference this diagram: http://ccnc.care/bpdiagram  Provider Name: [facility]     Phone: [phone]  Zone 1: ALL CLEAR  Continue to monitor your symptoms:  BP reading is less than 140 (top number) or less than 90 (bottom number)  No right upper stomach pain No headaches or seeing spots No feeling nauseated or throwing up No swelling in face and hands  Zone 2: CAUTION Call your doctor's office for any of the following:  BP reading is greater than 140 (  top number) or greater than 90 (bottom number)  Stomach pain under your ribs in the middle or right side Headaches or seeing spots Feeling nauseated or throwing up Swelling in face and hands  Zone 3: EMERGENCY  Seek immediate medical care if you have any of the following:  BP reading is greater than160 (top number) or greater than 110 (bottom number) Severe headaches not improving with Tylenol  Serious difficulty catching your breath Any worsening symptoms from Zone 2    First Trimester of Pregnancy The first trimester of pregnancy is from week 1 until the end of week 12 (months 1  through 3). A week after a sperm fertilizes an egg, the egg will implant on the wall of the uterus. This embryo will begin to develop into a baby. Genes from you and your partner are forming the baby. The female genes determine whether the baby is a boy or a girl. At 6-8 weeks, the eyes and face are formed, and the heartbeat can be seen on ultrasound. At the end of 12 weeks, all the baby's organs are formed.  Now that you are pregnant, you will want to do everything you can to have a healthy baby. Two of the most important things are to get good prenatal care and to follow your health care provider's instructions. Prenatal care is all the medical care you receive before the baby's birth. This care will help prevent, find, and treat any problems during the pregnancy and childbirth. BODY CHANGES Your body goes through many changes during pregnancy. The changes vary from woman to woman.  You may gain or lose a couple of pounds at first. You may feel sick to your stomach (nauseous) and throw up (vomit). If the vomiting is uncontrollable, call your health care provider. You may tire easily. You may develop headaches that can be relieved by medicines approved by your health care provider. You may urinate more often. Painful urination may mean you have a bladder infection. You may develop heartburn as a result of your pregnancy. You may develop constipation because certain hormones are causing the muscles that push waste through your intestines to slow down. You may develop hemorrhoids or swollen, bulging veins (varicose veins). Your breasts may begin to grow larger and become tender. Your nipples may stick out more, and the tissue that surrounds them (areola) may become darker. Your gums may bleed and may be sensitive to brushing and flossing. Dark spots or blotches (chloasma, mask of pregnancy) may develop on your face. This will likely fade after the baby is born. Your menstrual periods will stop. You may  have a loss of appetite. You may develop cravings for certain kinds of food. You may have changes in your emotions from day to day, such as being excited to be pregnant or being concerned that something may go wrong with the pregnancy and baby. You may have more vivid and strange dreams. You may have changes in your hair. These can include thickening of your hair, rapid growth, and changes in texture. Some women also have hair loss during or after pregnancy, or hair that feels dry or thin. Your hair will most likely return to normal after your baby is born. WHAT TO EXPECT AT YOUR PRENATAL VISITS During a routine prenatal visit: You will be weighed to make sure you and the baby are growing normally. Your blood pressure will be taken. Your abdomen will be measured to track your baby's growth. The fetal heartbeat will be listened to starting  around week 10 or 12 of your pregnancy. Test results from any previous visits will be discussed. Your health care provider may ask you: How you are feeling. If you are feeling the baby move. If you have had any abnormal symptoms, such as leaking fluid, bleeding, severe headaches, or abdominal cramping. If you have any questions. Other tests that may be performed during your first trimester include: Blood tests to find your blood type and to check for the presence of any previous infections. They will also be used to check for low iron levels (anemia) and Rh antibodies. Later in the pregnancy, blood tests for diabetes will be done along with other tests if problems develop. Urine tests to check for infections, diabetes, or protein in the urine. An ultrasound to confirm the proper growth and development of the baby. An amniocentesis to check for possible genetic problems. Fetal screens for spina bifida and Down syndrome. You may need other tests to make sure you and the baby are doing well. HOME CARE INSTRUCTIONS  Medicines Follow your health care provider's  instructions regarding medicine use. Specific medicines may be either safe or unsafe to take during pregnancy. Take your prenatal vitamins as directed. If you develop constipation, try taking a stool softener if your health care provider approves. Diet Eat regular, well-balanced meals. Choose a variety of foods, such as meat or vegetable-based protein, fish, milk and low-fat dairy products, vegetables, fruits, and whole grain breads and cereals. Your health care provider will help you determine the amount of weight gain that is right for you. Avoid raw meat and uncooked cheese. These carry germs that can cause birth defects in the baby. Eating four or five small meals rather than three large meals a day may help relieve nausea and vomiting. If you start to feel nauseous, eating a few soda crackers can be helpful. Drinking liquids between meals instead of during meals also seems to help nausea and vomiting. If you develop constipation, eat more high-fiber foods, such as fresh vegetables or fruit and whole grains. Drink enough fluids to keep your urine clear or pale yellow. Activity and Exercise Exercise only as directed by your health care provider. Exercising will help you: Control your weight. Stay in shape. Be prepared for labor and delivery. Experiencing pain or cramping in the lower abdomen or low back is a good sign that you should stop exercising. Check with your health care provider before continuing normal exercises. Try to avoid standing for long periods of time. Move your legs often if you must stand in one place for a long time. Avoid heavy lifting. Wear low-heeled shoes, and practice good posture. You may continue to have sex unless your health care provider directs you otherwise. Relief of Pain or Discomfort Wear a good support bra for breast tenderness.   Take warm sitz baths to soothe any pain or discomfort caused by hemorrhoids. Use hemorrhoid cream if your health care provider  approves.   Rest with your legs elevated if you have leg cramps or low back pain. If you develop varicose veins in your legs, wear support hose. Elevate your feet for 15 minutes, 3-4 times a day. Limit salt in your diet. Prenatal Care Schedule your prenatal visits by the twelfth week of pregnancy. They are usually scheduled monthly at first, then more often in the last 2 months before delivery. Write down your questions. Take them to your prenatal visits. Keep all your prenatal visits as directed by your health care provider. Safety  Wear your seat belt at all times when driving. Make a list of emergency phone numbers, including numbers for family, friends, the hospital, and police and fire departments. General Tips Ask your health care provider for a referral to a local prenatal education class. Begin classes no later than at the beginning of month 6 of your pregnancy. Ask for help if you have counseling or nutritional needs during pregnancy. Your health care provider can offer advice or refer you to specialists for help with various needs. Do not use hot tubs, steam rooms, or saunas. Do not douche or use tampons or scented sanitary pads. Do not cross your legs for long periods of time. Avoid cat litter boxes and soil used by cats. These carry germs that can cause birth defects in the baby and possibly loss of the fetus by miscarriage or stillbirth. Avoid all smoking, herbs, alcohol, and medicines not prescribed by your health care provider. Chemicals in these affect the formation and growth of the baby. Schedule a dentist appointment. At home, brush your teeth with a soft toothbrush and be gentle when you floss. SEEK MEDICAL CARE IF:  You have dizziness. You have mild pelvic cramps, pelvic pressure, or nagging pain in the abdominal area. You have persistent nausea, vomiting, or diarrhea. You have a bad smelling vaginal discharge. You have pain with urination. You notice increased swelling  in your face, hands, legs, or ankles. SEEK IMMEDIATE MEDICAL CARE IF:  You have a fever. You are leaking fluid from your vagina. You have spotting or bleeding from your vagina. You have severe abdominal cramping or pain. You have rapid weight gain or loss. You vomit blood or material that looks like coffee grounds. You are exposed to German measles and have never had them. You are exposed to fifth disease or chickenpox. You develop a severe headache. You have shortness of breath. You have any kind of trauma, such as from a fall or a car accident. Document Released: 07/30/2001 Document Revised: 12/20/2013 Document Reviewed: 06/15/2013 [facility] Patient Information 2015 [place], [place]. This information is not intended to replace advice given to you by your health care provider. Make sure you discuss any questions you have with your health care provider.  ADDITIONAL HEALTHCARE OPTIONS FOR PATIENTS  [facility] / e-Visit: [email or web address]         [facility]: [phone]  [facility]: [phone]                   [facility]: [phone]     Safe Medications in Pregnancy   Acne: Benzoyl Peroxide Salicylic Acid  Backache/Headache: Tylenol : 2 regular strength every 4 hours OR              2 Extra strength every 6 hours  Colds/Coughs/Allergies: Benadryl  (alcohol free) 25 mg every 6 hours as needed Breath right strips Claritin Cepacol throat lozenges Chloraseptic throat spray Cold-Eeze- up to three times per day Cough drops, alcohol free Flonase (by prescription only) Guaifenesin Mucinex Robitussin DM (plain only, alcohol free) Saline nasal spray/drops Sudafed (pseudoephedrine) & Actifed ** use only after [redacted] weeks gestation and if you do not have high blood pressure Tylenol  Vicks Vaporub Zinc lozenges Zyrtec   Constipation: Colace Ducolax suppositories Fleet  enema Glycerin  suppositories Metamucil Milk of magnesia Miralax Senokot Smooth move tea  Diarrhea: Kaopectate Imodium A-D  *NO pepto Bismol  Hemorrhoids: Anusol  Anusol  HC Preparation H Tucks  Indigestion: Tums Maalox Mylanta Zantac  Pepcid  Insomnia:  Benadryl  (alcohol free) 25mg  every 6 hours as needed Tylenol  PM Unisom , no Gelcaps  Leg Cramps: Tums MagGel  Nausea/Vomiting:  Bonine Dramamine Emetrol Ginger extract Sea bands Meclizine  Nausea medication to take during pregnancy:  Unisom  (doxylamine  succinate 25 mg tablets) Take one tablet daily at bedtime. If symptoms are not adequately controlled, the dose can be increased to a maximum recommended dose of two tablets daily (1/2 tablet in the morning, 1/2 tablet mid-afternoon and one at bedtime). Vitamin B6 100mg  tablets. Take one tablet twice a day (up to 200 mg per day).  Skin Rashes: Aveeno products Benadryl  cream or 25mg  every 6 hours as needed Calamine Lotion 1% cortisone cream  Yeast infection: Gyne-lotrimin 7 Monistat 7   **If taking multiple medications, please check labels to avoid duplicating the same active ingredients **take medication as directed on the label ** Do not exceed 4000 mg of tylenol  in 24 hours **Do not take medications that contain aspirin  or ibuprofen    [phone] is the phone number for Pregnancy Classes or hospital tours at [facility].   You will be referred to  [email or web address]   for more information on childbirth classes   At this site you may register for classes. You may sign up for a waiting list if classes are full. Please SIGN UP FOR THIS!.   When the waiting list becomes long, sometimes new classes can be added.  [facility] Call to Register: [phone] or [phone]   or   Register Online: [email or web address] THESE  CLASSES FILL UP VERY QUICKLY, SO SIGN UP AS SOON AS YOU CAN!!! Please visit [facility] pregnancy website at [email or web address]  Childbirth Classes  Option 1: Birth & Baby Series Series of 3 weekly classes, on the same day of the week (can choose Mon-Thurs) from 6-9pm Helps you and your support person prepare for childbirth Reviews newborn care, labor & birth, cesarean birth, pain management, and comfort techniques Cost: $60 per couple for insured or self-pay, $30 per couple for Medicaid  Option 2: Weekend Birth & Baby This class is a weekend version of our Birth & Baby series.  It is designed for parents who have a difficult time fitting several weeks of classes into their schedule.   Covers the care of your newborn and the basics of labor and childbirth Friday 6:30pm-8:30pm Saturday 9am-4pm, includes lunch for you and your partner  Cost: $75 per couple for insured or self-pay, $30 per couple for Medicaid  Option 3: Natural Childbirth This series of 5 weekly classes is for expectant parents who want to learn and practice natural methods of coping with the process of labor and childbirth.  Can choose Mon or Tues, 7-9pm.   Covers relaxation, breathing, massage, visualization, role of the partner, and helpful positioning Participants learn how to be confident in their body's ability to give birth. Class empowers and helps parents make informed decisions about care. Includes discussion that will help new parents transition into the immediate postpartum period.  Cost: $75 per couple for insured or self-pay, $30 per couple for Medicaid  Option 4: Online Birth & Baby This online class offers you the freedom to complete a Birth & Baby series in the comfort of your own home.  The flexibility of this option allows you to review sections at your own pace, at times convenient to you and your support people.  It includes additional video information, animations, quizzes and extended activities. Get  organized with helpful eClass tools, checklists,  and trackers.  Cost: $60 for 60 days of online access                                                                            Other Available Classes  Baby & Me Enjoy this time to discuss newborn & infant parenting topics and family adjustment issues with other new mothers in a relaxed environment. Each week brings a new speaker or baby-centered activity. We encourage mothers and their babies (birth to crawling) to join us . You are welcome to visit this group even if you haven't delivered yet! It's wonderful to make new friends early and watch other moms interact with their babies. No registration or fee.  Big Brother/Big Sister Let your children share in the joy of a new brother or sister in this special class designed just for them. Discussion includes how families care for babies: swaddling, holding, diapering, safety, as well as how they can be helpful in their new role. This class is designed for children ages 2 to 45, but any age is welcome. Please register each child individually. $5 Breastfeeding Support Group This group is a mother-to-mother support circle where moms have the opportunity to share their breastfeeding experiences. A Breastfeeding Support nurse is present for questions and concerns. An infant scale is available for weight checks. No fee or registration.  Breastfeeding Your Baby Breastfeeding is a special time for mother and child. This class will help you feel ready to begin this important relationship. Your partner is encouraged to attend with you. Learn what to expect and feel more confident in the first days of breastfeeding your newborn. This class also addresses the most common fears and challenges of breastfeeding during the first few weeks, months, and beyond. $30 per couple Caring for Baby This class is for expectant and adoptive parents who want to learn and practice the most up-to-date newborn care for their babies.  Focus is on birth through first six weeks of life. Topics include feeding, bathing, diapering, crying, umbilical cord care, circumcision care and safe sleep. Parents learn how to recognize symptoms of illness and when to call the pediatrician. Register only the mom-to-be and your partner can plan to come with you. (*Note: This class is included in the Birth & Baby series and the Weekend Birth & Baby classes.) $10 per couple Comfort Techniques & Tour This 2-hour interactive class is designed for those who either do not wish to take the Birth & Baby series or for those who prefer our online childbirth class, but don't want to miss the opportunity to learn and practice hands-on techniques. These skills can help relieve some of the discomfort of labor and encourage your baby to rotate toward the best position for birth. You and your partner will be able to try a variety of labor positions with birth balls and rebozos as well as practice breathing, relaxation, and visual techniques. $20 per couple [employer] [facility] This course offers Dads-to-be the tools and knowledge needed to feel confident on their journey to becoming new fathers. Experienced dads, who have been trained as coaches, teach dads-to-be how to hold, comfort, diapers, swaddle and play with their infant while being able to support the new mom  as well. $25 Grandparent Love Expecting a grandbaby? Learn about the latest infant care and safety recommendations and ways to support your own child as he or she transitions into the parenting role. $10 per person Infant and Child CPR Parents, grandparents, babysitters, and friends learn Cardio-Pulmonary Resuscitation skills for infants and children. You will also learn how to treat both conscious and unconscious choking infants and children. Register each participant individually. (Note: This Family & Friends program does not offer certification.) $20 per person Marvelous Multiples Expecting twins, triplets,  or more? This free 2-hour class covers the differences in labor, birth, parenting, and breastfeeding issues that face multiples' parents.  Maternity Care Center Virtual Tour  Online virtual tour of the new [place] [facility] at [facility] Talk This free mom-led group offers support and connection to mothers as they journey through the adjustments and struggles of that sometimes overwhelming first year after the birth of a child. A member of our staff will be present to share resources and additional support if needed, as you care for yourself and baby. You are welcome to visit this group before you deliver! It's wonderful to meet new friends early and watch other moms interact with their babies.  Waterbirth Class Interested in a waterbirth? This mandatory (unless you have taken this class in a prior pregnancy) free informational class will help you discover whether waterbirth is the right fit for you and is required if you are planning a waterbirth. Education about waterbirth itself, supplies you may need, and what you may need from your support team is included in this class. Partners are encouraged to come.

## 2024-09-24 LAB — CBC/D/PLT+RPR+RH+ABO+RUBIGG...
Antibody Screen: NEGATIVE
Basophils Absolute: 0 10*3/uL (ref 0.0–0.2)
Basos: 0 %
EOS (ABSOLUTE): 0 10*3/uL (ref 0.0–0.4)
Eos: 0 %
HCV Ab: NONREACTIVE
HIV Screen 4th Generation wRfx: NONREACTIVE
Hematocrit: 44.2 % (ref 34.0–46.6)
Hemoglobin: 14.6 g/dL (ref 11.1–15.9)
Hepatitis B Surface Ag: NEGATIVE
Immature Grans (Abs): 0 10*3/uL (ref 0.0–0.1)
Immature Granulocytes: 0 %
Lymphocytes Absolute: 2.1 10*3/uL (ref 0.7–3.1)
Lymphs: 23 %
MCH: 26.8 pg (ref 26.6–33.0)
MCHC: 33 g/dL (ref 31.5–35.7)
MCV: 81 fL (ref 79–97)
Monocytes Absolute: 0.4 10*3/uL (ref 0.1–0.9)
Monocytes: 4 %
Neutrophils Absolute: 6.7 10*3/uL (ref 1.4–7.0)
Neutrophils: 73 %
Platelets: 236 10*3/uL (ref 150–450)
RBC: 5.44 x10E6/uL — ABNORMAL HIGH (ref 3.77–5.28)
RDW: 14.7 % (ref 11.7–15.4)
RPR Ser Ql: NONREACTIVE
Rh Factor: POSITIVE
Rubella Antibodies, IGG: 1.41 {index}
WBC: 9.3 10*3/uL (ref 3.4–10.8)

## 2024-09-24 LAB — COMPREHENSIVE METABOLIC PANEL WITH GFR
ALT: 22 [IU]/L (ref 0–32)
AST: 21 [IU]/L (ref 0–40)
Albumin: 4.7 g/dL (ref 4.0–5.0)
Alkaline Phosphatase: 58 [IU]/L (ref 41–116)
BUN/Creatinine Ratio: 12 (ref 9–23)
BUN: 9 mg/dL (ref 6–20)
Bilirubin Total: 0.6 mg/dL (ref 0.0–1.2)
CO2: 19 mmol/L — ABNORMAL LOW (ref 20–29)
Calcium: 10.3 mg/dL — ABNORMAL HIGH (ref 8.7–10.2)
Chloride: 100 mmol/L (ref 96–106)
Creatinine, Ser: 0.74 mg/dL (ref 0.57–1.00)
Globulin, Total: 3.3 g/dL (ref 1.5–4.5)
Glucose: 76 mg/dL (ref 70–99)
Potassium: 4 mmol/L (ref 3.5–5.2)
Sodium: 136 mmol/L (ref 134–144)
Total Protein: 8 g/dL (ref 6.0–8.5)
eGFR: 118 mL/min/{1.73_m2}

## 2024-09-24 LAB — HEMOGLOBIN A1C
Est. average glucose Bld gHb Est-mCnc: 91 mg/dL
Hgb A1c MFr Bld: 4.8 % (ref 4.8–5.6)

## 2024-09-24 LAB — PROTEIN / CREATININE RATIO, URINE
Creatinine, Urine: 439.5 mg/dL
Protein, Ur: 35.2 mg/dL
Protein/Creat Ratio: 80 mg/g{creat} (ref 0–200)

## 2024-09-24 LAB — HCV INTERPRETATION

## 2024-09-29 ENCOUNTER — Ambulatory Visit: Admitting: Advanced Practice Midwife

## 2024-10-28 ENCOUNTER — Encounter: Admitting: Women's Health

## 2024-11-25 ENCOUNTER — Other Ambulatory Visit

## 2024-11-25 ENCOUNTER — Encounter: Admitting: Women's Health
